# Patient Record
Sex: Male | Born: 1965
Health system: Southern US, Community
[De-identification: ages and names within clinical notes are randomized; demographics above are authoritative.]

## PROBLEM LIST (undated history)

## (undated) DIAGNOSIS — I499 Cardiac arrhythmia, unspecified: Secondary | ICD-10-CM

## (undated) DIAGNOSIS — I639 Cerebral infarction, unspecified: Secondary | ICD-10-CM

## (undated) DIAGNOSIS — R51 Headache: Secondary | ICD-10-CM

## (undated) DIAGNOSIS — R519 Headache, unspecified: Secondary | ICD-10-CM

## (undated) DIAGNOSIS — M109 Gout, unspecified: Secondary | ICD-10-CM

## (undated) DIAGNOSIS — H547 Unspecified visual loss: Secondary | ICD-10-CM

## (undated) DIAGNOSIS — I4891 Unspecified atrial fibrillation: Secondary | ICD-10-CM

## (undated) DIAGNOSIS — I10 Essential (primary) hypertension: Secondary | ICD-10-CM

## (undated) DIAGNOSIS — I509 Heart failure, unspecified: Secondary | ICD-10-CM

## (undated) DIAGNOSIS — M1990 Unspecified osteoarthritis, unspecified site: Secondary | ICD-10-CM

## (undated) DIAGNOSIS — E785 Hyperlipidemia, unspecified: Secondary | ICD-10-CM

## (undated) HISTORY — DX: Gout, unspecified: M10.9

## (undated) HISTORY — DX: Essential (primary) hypertension: I10

## (undated) HISTORY — DX: Cerebral infarction, unspecified: I63.9

---

## 2017-08-22 DIAGNOSIS — I639 Cerebral infarction, unspecified: Secondary | ICD-10-CM

## 2017-08-22 HISTORY — DX: Cerebral infarction, unspecified: I63.9

## 2017-09-12 ENCOUNTER — Inpatient Hospital Stay (HOSPITAL_COMMUNITY): Payer: Commercial Managed Care - PPO

## 2017-09-12 ENCOUNTER — Inpatient Hospital Stay (HOSPITAL_BASED_OUTPATIENT_CLINIC_OR_DEPARTMENT_OTHER): Payer: Commercial Managed Care - PPO

## 2017-09-12 ENCOUNTER — Encounter (HOSPITAL_COMMUNITY): Payer: Self-pay | Admitting: Emergency Medicine

## 2017-09-12 ENCOUNTER — Emergency Department (HOSPITAL_COMMUNITY): Payer: Commercial Managed Care - PPO

## 2017-09-12 ENCOUNTER — Observation Stay (HOSPITAL_COMMUNITY)
Admission: EM | Admit: 2017-09-12 | Discharge: 2017-09-14 | Disposition: A | Discharge status: Home / Self Care | Payer: Commercial Managed Care - PPO | Attending: Internal Medicine | Admitting: Internal Medicine

## 2017-09-12 DIAGNOSIS — L97929 Non-pressure chronic ulcer of unspecified part of left lower leg with unspecified severity: Secondary | ICD-10-CM | POA: Insufficient documentation

## 2017-09-12 DIAGNOSIS — I110 Hypertensive heart disease with heart failure: Secondary | ICD-10-CM | POA: Insufficient documentation

## 2017-09-12 DIAGNOSIS — Z79899 Other long term (current) drug therapy: Secondary | ICD-10-CM | POA: Insufficient documentation

## 2017-09-12 DIAGNOSIS — Z8673 Personal history of transient ischemic attack (TIA), and cerebral infarction without residual deficits: Secondary | ICD-10-CM | POA: Diagnosis not present

## 2017-09-12 DIAGNOSIS — I671 Cerebral aneurysm, nonruptured: Secondary | ICD-10-CM | POA: Diagnosis not present

## 2017-09-12 DIAGNOSIS — Z8679 Personal history of other diseases of the circulatory system: Secondary | ICD-10-CM | POA: Diagnosis not present

## 2017-09-12 DIAGNOSIS — E785 Hyperlipidemia, unspecified: Secondary | ICD-10-CM | POA: Diagnosis not present

## 2017-09-12 DIAGNOSIS — L97909 Non-pressure chronic ulcer of unspecified part of unspecified lower leg with unspecified severity: Secondary | ICD-10-CM | POA: Diagnosis present

## 2017-09-12 DIAGNOSIS — I10 Essential (primary) hypertension: Secondary | ICD-10-CM | POA: Diagnosis present

## 2017-09-12 DIAGNOSIS — L97919 Non-pressure chronic ulcer of unspecified part of right lower leg with unspecified severity: Secondary | ICD-10-CM | POA: Insufficient documentation

## 2017-09-12 DIAGNOSIS — I5022 Chronic systolic (congestive) heart failure: Secondary | ICD-10-CM | POA: Diagnosis not present

## 2017-09-12 DIAGNOSIS — I83019 Varicose veins of right lower extremity with ulcer of unspecified site: Secondary | ICD-10-CM | POA: Diagnosis present

## 2017-09-12 DIAGNOSIS — R200 Anesthesia of skin: Secondary | ICD-10-CM | POA: Insufficient documentation

## 2017-09-12 DIAGNOSIS — Z6841 Body Mass Index (BMI) 40.0 and over, adult: Secondary | ICD-10-CM | POA: Diagnosis not present

## 2017-09-12 DIAGNOSIS — R7303 Prediabetes: Secondary | ICD-10-CM | POA: Diagnosis not present

## 2017-09-12 DIAGNOSIS — I481 Persistent atrial fibrillation: Principal | ICD-10-CM | POA: Insufficient documentation

## 2017-09-12 DIAGNOSIS — R202 Paresthesia of skin: Secondary | ICD-10-CM | POA: Diagnosis not present

## 2017-09-12 DIAGNOSIS — H5462 Unqualified visual loss, left eye, normal vision right eye: Secondary | ICD-10-CM | POA: Diagnosis not present

## 2017-09-12 DIAGNOSIS — R600 Localized edema: Secondary | ICD-10-CM

## 2017-09-12 DIAGNOSIS — I4891 Unspecified atrial fibrillation: Secondary | ICD-10-CM | POA: Diagnosis present

## 2017-09-12 DIAGNOSIS — R2990 Unspecified symptoms and signs involving the nervous system: Secondary | ICD-10-CM

## 2017-09-12 DIAGNOSIS — I6340 Cerebral infarction due to embolism of unspecified cerebral artery: Secondary | ICD-10-CM | POA: Insufficient documentation

## 2017-09-12 DIAGNOSIS — I517 Cardiomegaly: Secondary | ICD-10-CM | POA: Insufficient documentation

## 2017-09-12 DIAGNOSIS — E119 Type 2 diabetes mellitus without complications: Secondary | ICD-10-CM | POA: Insufficient documentation

## 2017-09-12 DIAGNOSIS — I482 Chronic atrial fibrillation, unspecified: Secondary | ICD-10-CM

## 2017-09-12 DIAGNOSIS — I340 Nonrheumatic mitral (valve) insufficiency: Secondary | ICD-10-CM | POA: Diagnosis not present

## 2017-09-12 DIAGNOSIS — L97901 Non-pressure chronic ulcer of unspecified part of unspecified lower leg limited to breakdown of skin: Secondary | ICD-10-CM

## 2017-09-12 DIAGNOSIS — Z7901 Long term (current) use of anticoagulants: Secondary | ICD-10-CM | POA: Insufficient documentation

## 2017-09-12 DIAGNOSIS — Z23 Encounter for immunization: Secondary | ICD-10-CM | POA: Insufficient documentation

## 2017-09-12 DIAGNOSIS — G9389 Other specified disorders of brain: Secondary | ICD-10-CM | POA: Diagnosis not present

## 2017-09-12 DIAGNOSIS — E669 Obesity, unspecified: Secondary | ICD-10-CM | POA: Diagnosis present

## 2017-09-12 DIAGNOSIS — I63431 Cerebral infarction due to embolism of right posterior cerebral artery: Secondary | ICD-10-CM

## 2017-09-12 DIAGNOSIS — R0989 Other specified symptoms and signs involving the circulatory and respiratory systems: Secondary | ICD-10-CM | POA: Diagnosis present

## 2017-09-12 DIAGNOSIS — I83029 Varicose veins of left lower extremity with ulcer of unspecified site: Secondary | ICD-10-CM

## 2017-09-12 HISTORY — DX: Heart failure, unspecified: I50.9

## 2017-09-12 HISTORY — DX: Hyperlipidemia, unspecified: E78.5

## 2017-09-12 HISTORY — DX: Unspecified atrial fibrillation: I48.91

## 2017-09-12 HISTORY — DX: Essential (primary) hypertension: I10

## 2017-09-12 LAB — COMPREHENSIVE METABOLIC PANEL
ALBUMIN: 3.2 g/dL — AB (ref 3.5–5.0)
ALT: 22 U/L (ref 17–63)
AST: 31 U/L (ref 15–41)
Alkaline Phosphatase: 105 U/L (ref 38–126)
Anion gap: 6 (ref 5–15)
BUN: 14 mg/dL (ref 6–20)
CHLORIDE: 102 mmol/L (ref 101–111)
CO2: 29 mmol/L (ref 22–32)
Calcium: 8.6 mg/dL — ABNORMAL LOW (ref 8.9–10.3)
Creatinine, Ser: 1.19 mg/dL (ref 0.61–1.24)
GFR calc Af Amer: >60 mL/min (ref >60)
GFR calc non Af Amer: >60 mL/min (ref >60)
GLUCOSE: 106 mg/dL — AB (ref 65–99)
POTASSIUM: 3.9 mmol/L (ref 3.5–5.1)
SODIUM: 137 mmol/L (ref 135–145)
Total Bilirubin: 1.5 mg/dL — ABNORMAL HIGH (ref 0.3–1.2)
Total Protein: 6.3 g/dL — ABNORMAL LOW (ref 6.5–8.1)

## 2017-09-12 LAB — I-STAT CHEM 8, ED
BUN: 23 mg/dL — AB (ref 6–20)
CREATININE: 1.1 mg/dL (ref 0.61–1.24)
Calcium, Ion: 0.97 mmol/L — ABNORMAL LOW (ref 1.15–1.40)
Chloride: 102 mmol/L (ref 101–111)
GLUCOSE: 107 mg/dL — AB (ref 65–99)
HCT: 39 % (ref 39.0–52.0)
Hemoglobin: 13.3 g/dL (ref 13.0–17.0)
Potassium: 8.3 mmol/L (ref 3.5–5.1)
Sodium: 135 mmol/L (ref 135–145)
TCO2: 30 mmol/L (ref 22–32)

## 2017-09-12 LAB — ECHOCARDIOGRAM COMPLETE
Ao-asc: 33 cm
E decel time: 190 msec
FS: 24 % — AB (ref 28–44)
IV/PV OW: 0.94
LADIAMINDEX: 1.48 cm/m2
LASIZE: 41 mm
LAVOL: 138 mL
LAVOLA4C: 123 mL
LAVOLIN: 50 mL/m2
LDCA: 3.46 cm2
LEFT ATRIUM END SYS DIAM: 41 mm
LV PW d: 17 mm — AB (ref 0.6–1.1)
LVOT diameter: 21 mm
MV Dec: 190
MV pk A vel: 46.1 m/s
MV pk E vel: 155 m/s
MVAP: 3.93 cm2
MVPG: 10 mmHg
P 1/2 time: 56 ms
TAPSE: 22.6 mm
VTI: 152 cm

## 2017-09-12 LAB — DIFFERENTIAL
BASOS PCT: 0 %
Basophils Absolute: 0 10*3/uL (ref 0.0–0.1)
EOS ABS: 0.2 10*3/uL (ref 0.0–0.7)
EOS PCT: 2 %
LYMPHS ABS: 1.3 10*3/uL (ref 0.7–4.0)
Lymphocytes Relative: 13 %
Monocytes Absolute: 0.8 10*3/uL (ref 0.1–1.0)
Monocytes Relative: 8 %
NEUTROS PCT: 77 %
Neutro Abs: 7.4 10*3/uL (ref 1.7–7.7)

## 2017-09-12 LAB — HEMOGLOBIN A1C
HEMOGLOBIN A1C: 5.7 % — AB (ref 4.8–5.6)
Mean Plasma Glucose: 116.89 mg/dL

## 2017-09-12 LAB — RAPID URINE DRUG SCREEN, HOSP PERFORMED
Amphetamines: NOT DETECTED
BARBITURATES: NOT DETECTED
Benzodiazepines: NOT DETECTED
Cocaine: NOT DETECTED
Opiates: NOT DETECTED
Tetrahydrocannabinol: NOT DETECTED

## 2017-09-12 LAB — PROTIME-INR
INR: 1.68
PROTHROMBIN TIME: 19.7 s — AB (ref 11.4–15.2)

## 2017-09-12 LAB — LIPID PANEL
CHOL/HDL RATIO: 3.8 ratio
Cholesterol: 106 mg/dL (ref 0–200)
HDL: 28 mg/dL — AB (ref >40)
LDL CALC: 66 mg/dL (ref 0–99)
TRIGLYCERIDES: 59 mg/dL (ref <150)
VLDL: 12 mg/dL (ref 0–40)

## 2017-09-12 LAB — CBC
HCT: 38.1 % — ABNORMAL LOW (ref 39.0–52.0)
HEMOGLOBIN: 11.6 g/dL — AB (ref 13.0–17.0)
MCH: 25 pg — AB (ref 26.0–34.0)
MCHC: 30.4 g/dL (ref 30.0–36.0)
MCV: 82.1 fL (ref 78.0–100.0)
PLATELETS: 322 10*3/uL (ref 150–400)
RBC: 4.64 MIL/uL (ref 4.22–5.81)
RDW: 18.6 % — ABNORMAL HIGH (ref 11.5–15.5)
WBC: 9.6 10*3/uL (ref 4.0–10.5)

## 2017-09-12 LAB — TSH: TSH: 0.645 u[IU]/mL (ref 0.350–4.500)

## 2017-09-12 LAB — BRAIN NATRIURETIC PEPTIDE: B NATRIURETIC PEPTIDE 5: 405.9 pg/mL — AB (ref 0.0–100.0)

## 2017-09-12 LAB — VITAMIN B12: VITAMIN B 12: 392 pg/mL (ref 180–914)

## 2017-09-12 LAB — CBG MONITORING, ED
Glucose-Capillary: 100 mg/dL — ABNORMAL HIGH (ref 65–99)
Glucose-Capillary: 92 mg/dL (ref 65–99)

## 2017-09-12 LAB — APTT: aPTT: 35 seconds (ref 24–36)

## 2017-09-12 LAB — GLUCOSE, CAPILLARY: GLUCOSE-CAPILLARY: 103 mg/dL — AB (ref 65–99)

## 2017-09-12 LAB — I-STAT TROPONIN, ED: Troponin i, poc: 0 ng/mL (ref 0.00–0.08)

## 2017-09-12 LAB — SEDIMENTATION RATE: Sed Rate: 18 mm/hr — ABNORMAL HIGH (ref 0–16)

## 2017-09-12 MED ORDER — PERFLUTREN LIPID MICROSPHERE
1.0000 mL | INTRAVENOUS | Status: AC | PRN
  Administered 2017-09-12: 3 mL via INTRAVENOUS
  Filled 2017-09-12: qty 10

## 2017-09-12 MED ORDER — CARVEDILOL 3.125 MG PO TABS
3.1250 mg | ORAL_TABLET | Freq: Two times a day (BID) | ORAL | Status: DC
  Administered 2017-09-12 – 2017-09-14 (×4): 3.125 mg via ORAL
  Filled 2017-09-12 (×4): qty 1

## 2017-09-12 MED ORDER — INSULIN ASPART 100 UNIT/ML ~~LOC~~ SOLN: 0.0000 [IU] | SUBCUTANEOUS | Status: DC

## 2017-09-12 MED ORDER — FUROSEMIDE 20 MG PO TABS
40.0000 mg | ORAL_TABLET | Freq: Every day | ORAL | Status: DC
  Administered 2017-09-13 – 2017-09-14 (×2): 40 mg via ORAL
  Filled 2017-09-12 (×2): qty 2

## 2017-09-12 MED ORDER — ROSUVASTATIN CALCIUM 5 MG PO TABS
5.0000 mg | ORAL_TABLET | Freq: Every day | ORAL | Status: DC
Start: 2017-09-12 — End: 2017-09-14
  Administered 2017-09-12 – 2017-09-13 (×2): 5 mg via ORAL
  Filled 2017-09-12 (×3): qty 1

## 2017-09-12 MED ORDER — WARFARIN SODIUM 7.5 MG PO TABS
12.5000 mg | ORAL_TABLET | Freq: Once | ORAL | Status: AC
  Administered 2017-09-12: 12.5 mg via ORAL
  Filled 2017-09-12: qty 1

## 2017-09-12 MED ORDER — ENOXAPARIN SODIUM 40 MG/0.4ML ~~LOC~~ SOLN: 40.0000 mg | SUBCUTANEOUS | Status: DC

## 2017-09-12 MED ORDER — ACETAMINOPHEN 160 MG/5ML PO SOLN: 650.0000 mg | ORAL | Status: DC | PRN

## 2017-09-12 MED ORDER — ACETAMINOPHEN 325 MG PO TABS
650.0000 mg | ORAL_TABLET | ORAL | Status: DC | PRN
  Administered 2017-09-12: 650 mg via ORAL
  Filled 2017-09-12: qty 2

## 2017-09-12 MED ORDER — ACETAMINOPHEN 650 MG RE SUPP: 650.0000 mg | RECTAL | Status: DC | PRN

## 2017-09-12 MED ORDER — ASPIRIN 325 MG PO TABS
325.0000 mg | ORAL_TABLET | Freq: Every day | ORAL | Status: DC
  Administered 2017-09-13 – 2017-09-14 (×2): 325 mg via ORAL
  Filled 2017-09-12 (×2): qty 1

## 2017-09-12 MED ORDER — STROKE: EARLY STAGES OF RECOVERY BOOK
Freq: Once | Status: AC
  Administered 2017-09-12: 16:00:00
  Filled 2017-09-12: qty 1

## 2017-09-12 MED ORDER — INFLUENZA VAC SPLIT QUAD 0.5 ML IM SUSY
0.5000 mL | PREFILLED_SYRINGE | INTRAMUSCULAR | Status: AC
  Administered 2017-09-13: 0.5 mL via INTRAMUSCULAR
  Filled 2017-09-12: qty 0.5

## 2017-09-12 MED ORDER — WARFARIN - PHARMACIST DOSING INPATIENT: Freq: Every day | Status: DC

## 2017-09-12 MED ORDER — ASPIRIN 300 MG RE SUPP: 300.0000 mg | Freq: Every day | RECTAL | Status: DC

## 2017-09-12 NOTE — ED Notes (Signed)
Patient transported to MRI 

## 2017-09-12 NOTE — ED Notes (Signed)
Lovenox and coreg not given as they are not available in the pyxis. Pt taken to floor.

## 2017-09-12 NOTE — ED Notes (Signed)
Pt now back from MRI.

## 2017-09-12 NOTE — ED Notes (Signed)
Patient transported to CT 

## 2017-09-12 NOTE — Progress Notes (Addendum)
ANTICOAGULATION CONSULT NOTE - Initial Consult  Pharmacy Consult for warfarin Indication: atrial fibrillation  No Known Allergies  Patient Measurements:    Vital Signs: Temp: 98.6 F (37 C) (10/22 1011) Temp Source: Oral (10/22 0957) BP: 152/120 (10/22 1300) Pulse Rate: 77 (10/22 1300)  Labs:  Recent Labs  09/12/17 1010 09/12/17 1031 09/12/17 1059  HGB 11.6* 13.3  --   HCT 38.1* 39.0  --   PLT 322  --   --   APTT 35  --   --   LABPROT 19.7*  --   --   INR 1.68  --   --   CREATININE  --  1.10 1.19    CrCl cannot be calculated (Unknown ideal weight.).   Medical History: Past Medical History:  Diagnosis Date  . Atrial fibrillation (HCC)   . CHF (congestive heart failure) (HCC)   . Hypertension     Medications:   (Not in a hospital admission)  Assessment: [age] YOM with h/o Afib on warfairn presented with neurological complaints. Pharmacy consulted to resume home warfarin therapy. H/H and Plt wnl. INR on admission is subtherapeutic at 1.68. Last dose of warfarin was yesterday  Home warfarin dose: 10 mg daily   Drug interactions: asa 325 mg daily   Goal of Therapy:  INR 2-3 Monitor platelets by anticoagulation protocol: Yes   Plan:  -Resume warfarin 12.5 mg once   -Monitor daily PT/INR -D/c lovenox when INR > 2   Francisco Martin, PharmD., BCPS Clinical Pharmacist Pager [phone]

## 2017-09-12 NOTE — ED Notes (Signed)
Echo cardio being completed at bedside.

## 2017-09-12 NOTE — ED Provider Notes (Signed)
[facility] EMERGENCY DEPARTMENT Provider Note   CSN: [id number] Arrival date & time: 09/12/17  6387     History   Chief Complaint Chief Complaint  Patient presents with  . Numbness    HPI Matthan Sledge is a 51 y.o. male.  HPI   Patient's 51 year old male with past medical history significant for A. fib on Coumadin, CHF, hypertension, hyperlipidemia, diabetes. He is presenting today with neurological complaints. He reports that this morning he woke up and he had "feeling of walking on air". He had numbness to bilateral lower extremities. He reports that were clumsy and he fell while trying to stand up from breakfast table. Additionally he reports left-sided visual deficits. He reports that while looking forward his left peripheral vision is fuzzy.  Past Medical History:  Diagnosis Date  . Atrial fibrillation (HCC)   . CHF (congestive heart failure) (HCC)   . Hypertension     There are no active problems to display for this patient.   History reviewed. No pertinent surgical history.     Home Medications    Prior to Admission medications   Medication Sig Start Date End Date Taking? Authorizing Provider  carvedilol (COREG) 12.5 MG tablet Take 25 tablets by mouth 2 (two) times daily. 09/10/17  Yes [provider]  cloNIDine (CATAPRES) 0.1 MG tablet Take 0.1 mg by mouth 3 (three) times daily. 07/31/17  Yes [provider]  furosemide (LASIX) 40 MG tablet Take 40 tablets by mouth daily. 08/16/17  Yes [provider]  Olmesartan-Amlodipine-HCTZ 40-10-25 MG TABS Take 1 tablet by mouth daily. 09/10/17  Yes [provider]  rosuvastatin (CRESTOR) 5 MG tablet Take 5 mg by mouth daily at 6 PM.   Yes [provider]  warfarin (COUMADIN) 10 MG tablet Take 10 mg by mouth daily. 06/22/17  Yes [provider]    Family History No family history on file.  Social History Social History  Substance Use Topics  .  Smoking status: Never Smoker  . Smokeless tobacco: Never Used  . Alcohol use No     Allergies   Patient has no known allergies.   Review of Systems Review of Systems  Constitutional: Negative for activity change.  Respiratory: Negative for shortness of breath.   Cardiovascular: Negative for chest pain.  Gastrointestinal: Negative for abdominal pain.  Neurological: Positive for weakness and numbness.     Physical Exam Updated Vital Signs BP (!) 154/91 (BP Location: Right Arm)   Pulse 75   Temp 98.6 F (37 C) (Oral)   Resp 20   SpO2 98%   Physical Exam  Constitutional: He is oriented to person, place, and time. He appears well-nourished.  HENT:  Head: Normocephalic.  Eyes: Conjunctivae are normal. Right eye exhibits no discharge. Left eye exhibits no discharge.  Cardiovascular: Normal rate and regular rhythm.   Pulmonary/Chest: Effort normal and breath sounds normal. No respiratory distress. He has no wheezes.  Abdominal: Soft. He exhibits no distension. There is no tenderness.  Neurological: He is oriented to person, place, and time.  Left sided homonymous hemianopia  No weakness, mild numbness bilateral LE.  Skin: Skin is warm and dry. He is not diaphoretic.  Psychiatric: He has a normal mood and affect. His behavior is normal.     ED Treatments / Results  Labs (all labs ordered are listed, but only abnormal results are displayed) Labs Reviewed  PROTIME-INR - Abnormal; Notable for the following:       Result  Value   Prothrombin Time 19.7 (*)    All other components within normal limits  CBG MONITORING, ED - Abnormal; Notable for the following:    Glucose-Capillary 100 (*)    All other components within normal limits  I-STAT CHEM 8, ED - Abnormal; Notable for the following:    Potassium 8.3 (*)    BUN 23 (*)    Glucose, Bld 107 (*)    Calcium, Ion 0.97 (*)    All other components within normal limits  APTT  CBC  DIFFERENTIAL  COMPREHENSIVE METABOLIC  PANEL  I-STAT TROPONIN, ED    EKG  EKG Interpretation None       Radiology No results found.  Procedures Procedures (including critical care time)  Medications Ordered in ED Medications - No data to display   Initial Impression / Assessment and Plan / ED Course  I have reviewed the triage vital signs and the nursing notes.  Pertinent labs & imaging results that were available during my care of the patient were reviewed by me and considered in my medical decision making (see chart for details).     Patient's 51 year old male with past medical history significant for A. fib on Coumadin, CHF, hypertension, hyperlipidemia, diabetes. He is presenting today with neurological complaints. He reports that this morning he woke up and he had "feeling of walking on air". He had numbness to bilateral lower extremities. He reports that were clumsy and he fell while trying to stand up from breakfast table. Additionally he reports left-sided visual deficits. He reports that while looking forward his left peripheral vision is fuzzy.   12:48 PM CT shows stroke. Discussed with Onalee HuaMcNeil Kirkpatrick. We'll admit to medicine for stroke workup.  Final Clinical Impressions(s) / ED Diagnoses   Final diagnoses:  None    New Prescriptions New Prescriptions   No medications on file     Abelino DerrickMackuen, Delylah Stanczyk Lyn, MD 09/12/17 1601

## 2017-09-12 NOTE — Progress Notes (Signed)
Notified MD of telemetry report of 2.04 pause, atrial fibrillation.

## 2017-09-12 NOTE — H&P (Signed)
History and Physical    Francisco Noraddie Conly ZOX:[id number] DOB: 04/05/1966 DOA: 09/12/2017   PCP: Simone CuriaLee, Keung, MD Gentry Fitz/UNASSIGNED  Attending physician: Onalee Huaavid  Patient coming from/Resides with: Private residence-6 with roommate  Chief Complaint: Bilateral lower extremity numbness, left visual field disturbance  HPI: Francisco Martin is a 51 y.o. male with medical history significant for obesity, hypertension, CHF of unknown subtype, atrial fibrillation on warfarin, dyslipidemia who presented to the ER after awakening this morning with bilateral leg numbness and reports of blurred vision involving the left eye.  Was in his normal state of health when he went to bed last night.  CT head without contrast revealed no evidence of acute infarction, hemorrhage, hydrocephalus or mass but did reveal mild infarcts in the right thalamus and parasagittal right occipital lobe.  Patient denies prior history of stroke.  INR was not therapeutic at 1.68.  Visual exam consistent with left visual field deficits.  Neurology was consulted.  ED Course: Vital Signs: BP (!) 152/120   Pulse 77   Temp 98.6 F (37 C)   Resp (!) 22   SpO2 98%  CT head: As above PCXR: No acute process Lab data: Sodium 137, potassium 3.9, chloride 102, CO2 29, glucose 106, BUN 14, creatinine 1.19, calcium 8.6, anion gap 6, alkaline phosphatase 105, albumin 3.2, AST 31, ALT 22, total bilirubin 1.5, poc troponin 0.00, white count 9600 with normal differential, hemoglobin 11.6, platelets 322,000, PT 19.7, INR 1.68, PTT 35 Medications and treatments: None  Review of Systems:  In addition to the HPI above,  No Fever-chills, myalgias or other constitutional symptoms No Headache, changes with Vision or hearing, new weakness, tingling, numbness in any extremity, dizziness, dysarthria or word finding difficulty, gait disturbance or imbalance, tremors or seizure activity No problems swallowing food or Liquids, indigestion/reflux, choking or coughing while  eating, abdominal pain with or after eating No Chest pain, Cough or Shortness of Breath, palpitations, orthopnea or DOE No Abdominal pain, N/V, melena,hematochezia, dark tarry stools, constipation No dysuria, malodorous urine, hematuria or flank pain No new skin rashes, lesions, masses or bruises, No new joint pains, aches, swelling or redness No recent unintentional weight gain or loss No polyuria, polydypsia or polyphagia   Past Medical History:  Diagnosis Date  . Atrial fibrillation (HCC)   . CHF (congestive heart failure) (HCC)   . Hypertension     History reviewed. No pertinent surgical history.  Social History   Social History  . Marital status: Unknown    Spouse name: N/A  . Number of children: N/A  . Years of education: N/A   Occupational History  . Not on file.   Social History Main Topics  . Smoking status: Never Smoker  . Smokeless tobacco: Never Used  . Alcohol use No  . Drug use: No  . Sexual activity: Not on file   Other Topics Concern  . Not on file   Social History Narrative  . 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 Authorizing Provider  carvedilol (COREG) 12.5 MG tablet Take 25 tablets by mouth 2 (two) times daily. 09/10/17  Yes [provider]  cloNIDine (CATAPRES) 0.1 MG tablet Take 0.1 mg by mouth 3 (three) times daily. 07/31/17  Yes [provider]  furosemide (LASIX) 40 MG  tablet Take 40 tablets by mouth daily. 08/16/17  Yes [provider]  Olmesartan-Amlodipine-HCTZ 40-10-25 MG TABS Take 1 tablet by mouth daily. 09/10/17  Yes [provider]  rosuvastatin (CRESTOR) 5 MG tablet Take 5 mg by mouth daily at 6 PM.   Yes [provider]  warfarin (COUMADIN) 10 MG tablet Take  10 mg by mouth daily. 06/22/17  Yes [provider]    Physical Exam: Vitals:   09/12/17 1215 09/12/17 1230 09/12/17 1245 09/12/17 1300  BP: (!) 132/96 (!) 129/105 (!) 141/93 (!) 152/120  Pulse: 79 76 64 77  Resp: (!) 21 (!) 30 (!) 23 (!) 22  Temp:      TempSrc:      SpO2: 97% 96% 98% 98%      Constitutional: NAD, calm, comfortable Eyes: PERRL, lids and conjunctivae normal ENMT: Mucous membranes are moist. Posterior pharynx clear of any exudate or lesions. poor dentition. Neck thick Neck: normal, supple, no masses, no thyromegaly Respiratory: clear to auscultation bilaterally, no wheezing, no crackles. Normal respiratory effort. No accessory muscle use.  Cardiovascular: Irregular rate with atrial fibrillation, no rubs / gallops. Grade 3/6 tight systolic murmur left sternal border third intercostal space at the apex.  3+ bilateral lower extremity edema L>R.  Unable to palpate pedal pulses-both feet cool to touch with right foot much cooler than left. No carotid bruits.  Abdomen: no tenderness, no masses palpated. No hepatosplenomegaly. Bowel sounds positive.  Musculoskeletal: no clubbing / cyanosis. No joint deformity upper and lower extremities. Good ROM, no contractures. Normal muscle tone.  Skin: See abnormalities in lower extremities as documented below Neurologic: CN 2-12 grossly intact except for apparent bilateral left upper field visual deficit when tested. Sensation intact, DTR difficult. Strength 5/5 x all 4 extremities.  Psychiatric: Normal judgment and insight. Alert and oriented x 3. Normal mood.    Wound right leg   Wound right leg   Wound left leg   Wound left leg  Labs on Admission: I have personally reviewed following labs and imaging studies  CBC:  Recent Labs Lab 09/12/17 1010 09/12/17 1031  WBC 9.6  --   NEUTROABS 7.4  --   HGB 11.6* 13.3  HCT 38.1* 39.0  MCV 82.1  --   PLT 322  --    Basic Metabolic Panel:  Recent Labs Lab  09/12/17 1031 09/12/17 1059  NA 135 137  K 8.3* 3.9  CL 102 102  CO2  --  29  GLUCOSE 107* 106*  BUN 23* 14  CREATININE 1.10 1.19  CALCIUM  --  8.6*   GFR: CrCl cannot be calculated (Unknown ideal weight.). Liver Function Tests:  Recent Labs Lab 09/12/17 1059  AST 31  ALT 22  ALKPHOS 105  BILITOT 1.5*  PROT 6.3*  ALBUMIN 3.2*   No results for input(s): LIPASE, AMYLASE in the last 168 hours. No results for input(s): AMMONIA in the last 168 hours. Coagulation Profile:  Recent Labs Lab 09/12/17 1010  INR 1.68   Cardiac Enzymes: No results for input(s): CKTOTAL, CKMB, CKMBINDEX, TROPONINI in the last 168 hours. BNP (last 3 results) No results for input(s): PROBNP in the last 8760 hours. HbA1C: No results for input(s): HGBA1C in the last 72 hours. CBG:  Recent Labs Lab 09/12/17 1017  GLUCAP 100*   Lipid Profile: No results for input(s): CHOL, HDL, LDLCALC, TRIG, CHOLHDL, LDLDIRECT in the last 72 hours. Thyroid Function Tests: No results for input(s): TSH, T4TOTAL, FREET4, T3FREE, THYROIDAB in the  last 72 hours. Anemia Panel: No results for input(s): VITAMINB12, FOLATE, FERRITIN, TIBC, IRON, RETICCTPCT in the last 72 hours. Urine analysis: No results found for: COLORURINE, APPEARANCEUR, LABSPEC, PHURINE, GLUCOSEU, HGBUR, BILIRUBINUR, KETONESUR, PROTEINUR, UROBILINOGEN, NITRITE, LEUKOCYTESUR Sepsis Labs: @LABRCNTIP (procalcitonin:4,lacticidven:4) )No results found for this or any previous visit (from the past 240 hour(s)).   Radiological Exams on Admission: Ct Head Wo Contrast  Result Date: 09/12/2017 CLINICAL DATA:  Sudden onset of bilateral leg and foot numbness. Symptoms after taking a shower this morning. EXAM: CT HEAD WITHOUT CONTRAST TECHNIQUE: Contiguous axial images were obtained from the base of the skull through the vertex without intravenous contrast. COMPARISON:  None. FINDINGS: Brain: No evidence of acute infarction, hemorrhage, hydrocephalus,  extra-axial collection or mass lesion/mass effect. There is absence of the septum pellucidum and ventriculomegaly. The temporal horns and third ventricle are not dilated arguing against an obstructive hydrocephalus. There is paucity of cerebral white matter without ventricular scalloping and this may reflect an early gobal insult. Falx is formed and there appears to be an intact, thin corpus callosum. The optic chiasm has a thin appearance, but at the level of the orbits the optic nerve sheath complexes have grossly normal bulk. Well-defined low-density in the right thalamus. Parasagittal right occipital lobe cortically based low-density with volume loss. Question tiny calcification Vascular: No hyperdense vessel or unexpected calcification. Skull: Normal. Negative for fracture or focal lesion. Sinuses/Orbits: No acute finding. IMPRESSION: 1. No acute finding. 2. Remote infarcts in the right thalamus and parasagittal right occipital lobe. 3. Lateral ventriculomegaly which may be sequela of early insult. Absent septum pellucidum. Electronically Signed   By: Marnee Spring M.D.   On: 09/12/2017 11:38   Dg Chest Port 1 View  Result Date: 09/12/2017 CLINICAL DATA:  Numbness in both legs today. EXAM: PORTABLE CHEST 1 VIEW COMPARISON:  None. FINDINGS: The cardiopericardial silhouette is enlarged. Lungs are clear. No pneumothorax or pleural effusion. No focal bony abnormality. IMPRESSION: Prominent cardiopericardial silhouette consistent with cardiomegaly and/or pericardial effusion. Lungs clear. Electronically Signed   By: Drusilla Kanner M.D.   On: 09/12/2017 13:40    EKG: (Independently reviewed) atrial fibrillation with ventricular rate 69 bpm, QTC 456 ms, normal R wave rotation, no acute ischemic changes, no previous EKGs for comparison  Assessment/Plan Principal Problem:   Stroke-like symptoms -Patient presents with acute onset of strokelike symptoms consisting of bilateral lower extremity numbness  (which has improved) and lateral left field focal visual deficit consistent with partial hemianopsia concerning for stroke -Formal neurological consultation in process -CT head does demonstrate evidence of prior remote infarcts in the right thalamus and parasagittal occipital lobe -MR/MRA brain -PT/OT/SLP evaluation -Echocardiogram and carotid duplex -Hemoglobin A1c (patient reports was 5.6 three weeks ago) and FLP -Platelet with full dose aspirin -Frequent neurological checks  Active Problems:   Lower extremity ulceration  -Patient reports wounds related to trauma sustained at hitting legs against objects at work -Recently completed a course of doxycycline -Of note patient has cool feet with nonpalpable pulses and presents with bilateral foot numbness so will obtain ABIs and arterial duplex -WOC RN to assist with wound care -Check TSH, vitamin B12, RBC folate    Atrial fibrillation  -Currently rate controlled -Continue carvedilol -INR subtherapeutic -Pharmacy to manage warfarin dosing -CHADVASC=4 (heart failure, hypertension, history of stroke based on CT scan)    History of chronic CHF -Patient does not know subtype -Follow-up on echocardiogram -On beta-blocker, thiazide diuretic, furosemide and ARB at home -Holding some antihypertensives in setting of  acute strokelike symptoms and need to allow for permissive hypertension -No respiratory symptoms and current chest x-ray unremarkable    HTN (hypertension) -Currently controlled -Continuing carvedilol and Lasix but will hold clonidine, olmesartan, amlodipine and hydrochlorothiazide acutely    HLD (hyperlipidemia) -Continue Crestor    Prediabetes -Patient reports hemoglobin A1c was 5.6 THREE weeks ago    Obesity -Check weight      DVT prophylaxis: Warfarin Code Status: Full Family Communication: No family at bedside Disposition Plan: Home Consults called: Neurology/Kirkpatrick    ELLIS,ALLISON L. ANP-BC [facility] Pager [phone]   If 7PM-7AM, please contact night-coverage [email or web address] Password [email or web address]  09/12/2017, 2:26 PM

## 2017-09-12 NOTE — Progress Notes (Signed)
  Echocardiogram 2D Echocardiogram has been performed.  Francisco Martin 09/12/2017, 4:24 PM

## 2017-09-12 NOTE — Progress Notes (Signed)
Report given by Merry ProudBrandi in the ED regarding patient's condition.

## 2017-09-12 NOTE — Consult Note (Signed)
Neurology Consultation Reason for Consult: Stroke Referring Physician: Dr. Gerald Leitz  CC: Left sided vision loss  History is obtained from: the patient  HPI: Francisco Martin is a 51 y.o. male with a history of atrial fibrillation, hypertension, hyperlipidemia, and congestive heart failure present with new onset of left sided vision loss and bilateral lower extremity parasthesias since waking up this morning. He was feeling in his usual health last night and does not recall any unusual events. His symptoms were present since waking around 7:00am. He notes that the sense of numbness and tingling in his feet has partially improved since this morning but his vision changes have persisted without signfiicant improvement. He has had numbness in his feet before but has never suffered vision deficits or needed corrective lenses. He denies recently missing medication, substance use, trauma, fever, diarrhea, or any loss of consciousness.   LKW: Evening 10/21 tpa given?: no, outside window, anticoagulation Premorbid modified rankin scale: 0 ICH Score: 0  NIHSS: 2 - Bilateral hemianopia   ROS: A 14 point ROS was performed and is negative except as noted in the HPI.  Past Medical History:  Diagnosis Date  . Atrial fibrillation ([place])   . CHF (congestive heart failure) ([place])   . Hypertension      No family history on file.   Social History:  reports that he has never smoked. He has never used smokeless tobacco. He reports that he does not drink alcohol or use drugs.   Exam: Current vital signs: BP (!) 143/67   Pulse 90   Temp 98.3 F (36.8 C) (Oral)   Resp (!) 26   Ht 5' 9"  (1.753 m)   Wt (!) 325 lb (147.4 kg)   SpO2 99%   BMI 47.99 kg/m  Vital signs in last 24 hours: Temp:  [98.3 F (36.8 C)-98.6 F (37 C)] 98.3 F (36.8 C) (10/22 1710) Pulse Rate:  [62-90] 90 (10/22 1710) Resp:  [19-30] 26 (10/22 1615) BP: (129-154)/(67-120) 143/67 (10/22 1710) SpO2:  [94 %-99 %] 99 %  (10/22 1710) Weight:  [325 lb (147.4 kg)] 325 lb (147.4 kg) (10/22 1617)   Physical Exam  Constitutional: Obese man, well-developed and in no acute distress Psych: Affect appropriate to situation Eyes: No scleral injection HENT: No OP obstrucion Head: Normocephalic.  Cardiovascular: Irregular rhythm, normal rate, blowing systolic murmur loudest near RLSB/apex Respiratory: Effort normal and breath sounds normal to anterior ascultation GI: Soft.  No distension. There is no tenderness.  Skin: WDI  Neuro: Mental Status: Patient is awake, alert, oriented to person, place, month, year, and situation. Patient is able to give a clear and coherent history. No signs of aphasia or neglect Cranial Nerves: II: Pupils are equal, round, and reactive to light. Left visual fields are impaired bilaterally III,IV, VI: EOMI without ptosis or diploplia. Cannot track rapid leftward movement without repeated prompting. V: Facial sensation is symmetric to temperature VII: Facial movement is symmetric.  VIII: hearing is intact to voice X: Uvula elevates symmetrically XI: Shoulder shrug is symmetric. XII: tongue is midline without atrophy or fasciculations.  Motor: Tone is normal. Bulk is normal. 5/5 strength was present in all four extremities.  Sensory: Sensation is symmetric to light touch in the arms and legs. Deep Tendon Reflexes: 2+ and symmetric in the patellae.  Plantars: Toes are downgoing bilaterally.  Cerebellar: FNF is intact bilaterally   I have reviewed labs in epic and the results pertinent to this consultation are: INR: 1.68 LDL: 66 Hgb A1c:  5.7% ESR: 18 Serum Creatinine: 1.19   I have reviewed the images obtained:  CT Head Wo Contrast 09/12/2017 1. No acute finding. 2. Remote infarcts in the right thalamus and parasagittal right occipital lobe. 3. Lateral ventriculomegaly which may be sequela of early insult. Absent septum pellucidum.  DG Chest Port 1  View 09/12/2017 Prominent cardiopericardial silhouette consistent with cardiomegaly and/or pericardial effusion.  Impression: Deficits show homonomous left hemianopia suggesting a Right occipital lobe lesion. This would most likely be due to embolic stroke considering his known atrial fibrillation, currently subtherapeutic on coumadin anticoagulation. Intrinsic vessel disease is also possible and he does have risk factors in HTN, HLD, and CHF. There is evidence of previous infarct on CT head in this region but no obvious acute injury. MRI does not demonstrate any acute infarction although old injury is redemonstrated. Possibly there is worsening of existing injured region consider hypoperfusion, generalized insults.  This does not explain his increased foot numbness but this finding appears to be at least partially improved without intervention. This could be related to his lower extremity edema. Folate or B12 deficiency less likely with acute findings but with report of recurrence worth checking. This is less likely related to a CNS process causing vision change.  Recommendations: 1) Lipid panel, Hgb A1c, B12, Folate, HIV 2) Echocardiogram 3) Carotid Dopplers 4) ASA prophylaxis 349m 5) Telemetry monitoring 6) Risk factor modification 7) Consider OT Consultation for visual field deficits  Please see attending note attestation for additional recommendations.  CCollier Salina MD PGY-III Internal Medicine Resident Pager# [phone]/22/2018, 5:41 PM   If 7pm- 7am, please page neurology on call as listed in [facility]

## 2017-09-12 NOTE — ED Notes (Signed)
ED Provider at bedside. 

## 2017-09-12 NOTE — Progress Notes (Signed)
Francisco Martin is a 51 y.o. male patient admitted from ED awake, alert - oriented  X 4 - no acute distress noted.  VSS - Blood pressure (!) 143/67, pulse 90, temperature 98.3 F (36.8 C), temperature source Oral, resp. rate (!) 26, height 5\' 9"  (1.753 m), weight (!) 147.4 kg (325 lb), SpO2 99 %.    IV in place, occlusive dsg intact without redness.  Orientation to room, and floor completed with information packet given to patient/family. Patient declined safety video at this time.  Admission INP armband ID verified with patient/family, and in place.   Stand by assist, fall assessment complete, with patient able to verbalize understanding of risk associated with falls, and verbalized understanding to call nsg before up out of bed. Call light within reach, patient able to voice, and demonstrate understanding.  Skin, clean and dry without evidence of bruising, but has skin tears on BLE.   Evidence of skin break down noted on exam.     Will cont to eval and treat per MD orders.  Orene DesanctisSTANISHA  Jacie Tristan, RN 09/12/2017 5:52 PM

## 2017-09-12 NOTE — ED Triage Notes (Signed)
Pt with [facility] who reports waking up around 0600 with bilateral leg numbness. He went to work but was unable to stay. Pt also reports left side peripheral fussyness, which has not resolved. LKW 2100. Denies chest pain, LOC, SOB. Hx of afib, CHF and HTN. A/O NAD. Vitals stable.  +2-3 pitting edema and weeping in bilateral legs.   Pt on coumadin for a.fib

## 2017-09-12 NOTE — ED Notes (Signed)
Got patient undress on the monitor did ekg shown to Dr Machuen patient is resting with call bell in reach 

## 2017-09-13 ENCOUNTER — Inpatient Hospital Stay (HOSPITAL_COMMUNITY): Payer: Commercial Managed Care - PPO

## 2017-09-13 ENCOUNTER — Inpatient Hospital Stay (HOSPITAL_BASED_OUTPATIENT_CLINIC_OR_DEPARTMENT_OTHER): Payer: Commercial Managed Care - PPO

## 2017-09-13 DIAGNOSIS — I739 Peripheral vascular disease, unspecified: Secondary | ICD-10-CM | POA: Diagnosis not present

## 2017-09-13 DIAGNOSIS — L97929 Non-pressure chronic ulcer of unspecified part of left lower leg with unspecified severity: Secondary | ICD-10-CM

## 2017-09-13 DIAGNOSIS — L97919 Non-pressure chronic ulcer of unspecified part of right lower leg with unspecified severity: Secondary | ICD-10-CM | POA: Diagnosis not present

## 2017-09-13 DIAGNOSIS — I83019 Varicose veins of right lower extremity with ulcer of unspecified site: Secondary | ICD-10-CM | POA: Diagnosis present

## 2017-09-13 DIAGNOSIS — I482 Chronic atrial fibrillation: Secondary | ICD-10-CM

## 2017-09-13 DIAGNOSIS — R0989 Other specified symptoms and signs involving the circulatory and respiratory systems: Secondary | ICD-10-CM | POA: Diagnosis present

## 2017-09-13 DIAGNOSIS — I671 Cerebral aneurysm, nonruptured: Secondary | ICD-10-CM

## 2017-09-13 DIAGNOSIS — E785 Hyperlipidemia, unspecified: Secondary | ICD-10-CM | POA: Diagnosis not present

## 2017-09-13 DIAGNOSIS — R2990 Unspecified symptoms and signs involving the nervous system: Secondary | ICD-10-CM | POA: Diagnosis present

## 2017-09-13 DIAGNOSIS — Z8679 Personal history of other diseases of the circulatory system: Secondary | ICD-10-CM | POA: Diagnosis not present

## 2017-09-13 DIAGNOSIS — I6340 Cerebral infarction due to embolism of unspecified cerebral artery: Secondary | ICD-10-CM | POA: Insufficient documentation

## 2017-09-13 DIAGNOSIS — I83029 Varicose veins of left lower extremity with ulcer of unspecified site: Secondary | ICD-10-CM | POA: Diagnosis not present

## 2017-09-13 DIAGNOSIS — L97901 Non-pressure chronic ulcer of unspecified part of unspecified lower leg limited to breakdown of skin: Secondary | ICD-10-CM | POA: Diagnosis not present

## 2017-09-13 DIAGNOSIS — I63431 Cerebral infarction due to embolism of right posterior cerebral artery: Secondary | ICD-10-CM

## 2017-09-13 LAB — HIV ANTIBODY (ROUTINE TESTING W REFLEX): HIV SCREEN 4TH GENERATION: NONREACTIVE

## 2017-09-13 LAB — PROTIME-INR
INR: 1.36
PROTHROMBIN TIME: 16.7 s — AB (ref 11.4–15.2)

## 2017-09-13 LAB — FOLATE RBC
FOLATE, RBC: 909 ng/mL (ref >498)
Folate, Hemolysate: 338.1 ng/mL
HEMATOCRIT: 37.2 % — AB (ref 37.5–51.0)

## 2017-09-13 LAB — GLUCOSE, CAPILLARY
GLUCOSE-CAPILLARY: 90 mg/dL (ref 65–99)
GLUCOSE-CAPILLARY: 91 mg/dL (ref 65–99)
Glucose-Capillary: 101 mg/dL — ABNORMAL HIGH (ref 65–99)
Glucose-Capillary: 78 mg/dL (ref 65–99)
Glucose-Capillary: 83 mg/dL (ref 65–99)
Glucose-Capillary: 97 mg/dL (ref 65–99)

## 2017-09-13 MED ORDER — WARFARIN SODIUM 7.5 MG PO TABS
15.0000 mg | ORAL_TABLET | Freq: Once | ORAL | Status: AC
  Administered 2017-09-13: 15 mg via ORAL
  Filled 2017-09-13: qty 2

## 2017-09-13 MED ORDER — ENOXAPARIN SODIUM 40 MG/0.4ML ~~LOC~~ SOLN
40.0000 mg | SUBCUTANEOUS | Status: DC
  Administered 2017-09-13 – 2017-09-14 (×2): 40 mg via SUBCUTANEOUS
  Filled 2017-09-13: qty 0.4

## 2017-09-13 NOTE — Progress Notes (Signed)
PROGRESS NOTE  Francisco Martin MVH:[id number] DOB: 1966/07/09 DOA: 09/12/2017 PCP: Francisco Curia, MD  HPI/Recap of past 24 hours: Mr. Francisco Martin is a 51 year old male with medical history significant forAtrial fibrillation on warfarin therapy, hypertension, lipidemia hyperlipidemia, systolic heart failure who presented on 10/22 with new onset of left-sided vision loss and bilateral lower extremity paresthesias found to have previous stroke on MRI imaging (patient was unaware) Neurology was consulted on admission ending presentation consistent with reemergence of previous stroke related deficits.  Hospital course complicated by decreased pedal pulses concerning for PAD versus venous stasis ulcers.  Patient reports no acute events overnight.  Patient denies any cough, shortness of breath, abdominal pain, nausea or vomiting.  Assessment/Plan: Principal Problem:   Stroke-like symptoms Active Problems:   Atrial fibrillation (HCC)   History of chronic CHF   HLD (hyperlipidemia)   HTN (hypertension)   Prediabetes   Obesity   Lower extremity ulceration (HCC)   Venous stasis ulcers of both lower extremities (HCC)   Decreased pedal pulses  Homonymous left hemianopsia, likely reemergence of previous stroke related deficits(remote right PCA and right thalamic infarct)  No acute findings found on MRI brain or MRA to suggest new stroke.   Etiology of chronic stroke likely secondary to A. fib with subtherapeutic INR HIV, folate, hemoglobin A1c, lipid profile all within normal limits -Neurology/stroke team recommendations:  Continue Coumadin and aspirin 325 mg until INR therapeutic, then aspirin can be discontinued  Neurology follow-up on discharge scheduled  Driving restrictions: Patient should not drive unless left hemianopi resolves  Left and right ICA cavernous aneurysm Found on MRA.  Carotid ultrasound showed no evidence of stenosis in right ICA, 1-39% stenosis and left carotid -Follow-up  arranged with IR as outpatient  Chronic paresthesias, bilaterally in feet, stable Decreased pedal pulses, resolved PAD ruled out with normal ABI.  B12 low normal at 392.  Following B12 with concern for possible peripheral neuropathy.  Upon further review with patient numbness in legs is chronic problem -Continue to follow-up with PCP   Venous stasis ulcers on bilateral lower extremities, chronic(POA), stable -Wound care nurse following  -- Recommends Unna boot, weekly wound care or home health nursing for [employer] change  -- Long-term compression stockings  Severe mitral regurgitation, unclear if new Noted on TTE on admission.  No prior history. Suspect ischemic in etiology given diffuse hypokinesis noted on TTE -Follow-up with PCP. - Needs outpatient cardiology follow-up: TEE for better assessment and To rule out ischemic etiology  . Persistent atrial fibrillation, chronic, stable INR subtherapeutic on admission Takes 10 mg warfarin at home.  He may increase dose to 50 mg warfarin 1 time during hospital course. -We will discharged on Coumadin 10 mg in addition to aspirin until INR is therapeutic -Monitor INR   Chronic systolic heart failure, euvolemic TTE shows EF of 40%.  Query ischemic etiology given diffuse hypokinesis. - Carvedilol, Lasix -Appropriate med rec, listed as patient taking combo amlodipine-HCTZ-losartan, but unclear -Daily weights, monitor I's and O's    Code Status: Full code Family Communication: Updated patient, no family at bedside  Disposition Plan: Discharge tomorrow on 10/24   Consultants:  Neurology  Procedures:  10/22: TTE 40-45% with diffuse hypokinesis.  There is possibly severe mitral regurgitation but was not fully visualized.  We will consider TEE to further assess.  The patient was in atrial fibrillation.  Technically difficult study with poor acoustic windows.   10/23 Carotid ultrasound: Right carotid- no stenosis, left carotid-1-39%  stenosis both vertebral  arteries were patent with antegrade flow   10/23 Ankle brachial index: Within normal limits bilaterally at rest.  Right ABI 1.12, left ABI 1.31  Antimicrobials:  None  DVT prophylaxis: Warfarin, aspirin     Objective: Vitals:   09/12/17 2300 09/13/17 0200 09/13/17 0357 09/13/17 0948  BP: 128/88 133/84 139/84 (!) 160/110  Pulse: 83 66 (!) 56 76  Resp: 18 18 18 20   Temp: 98.2 F (36.8 C) 98.8 F (37.1 C) 98.1 F (36.7 C) 97.6 F (36.4 C)  TempSrc: Oral Oral Oral Oral  SpO2: 95% 96% 97% 100%  Weight:      Height:        Intake/Output Summary (Last 24 hours) at 09/13/17 1109 Last data filed at 09/13/17 0908  Gross per 24 hour  Intake                0 ml  Output             1525 ml  Net            -1525 ml   Filed Weights   09/12/17 1617 09/12/17 1753  Weight: (!) 147.4 kg (325 lb) (!) 149.9 kg (330 lb 7.5 oz)    Exam:   General: Sitting in a bedside chair, in no apparent distress, pleasant in conversation  Cardiovascular: Irregularly irregular rhythm  Respiratory: Clear to auscultation on anterior chest field, on room air  Skin: weeping (nonpurulent) drainage from wound on right lower leg, 2 dry, crusted over lesions on lower left leg  Psychiatry: normal affect and mood   Data Reviewed: CBC:  Recent Labs Lab 09/12/17 1010 09/12/17 1031  WBC 9.6  --   NEUTROABS 7.4  --   HGB 11.6* 13.3  HCT 38.1* 39.0  MCV 82.1  --   PLT 322  --    Basic Metabolic Panel:  Recent Labs Lab 09/12/17 1031 09/12/17 1059  NA 135 137  K 8.3* 3.9  CL 102 102  CO2  --  29  GLUCOSE 107* 106*  BUN 23* 14  CREATININE 1.10 1.19  CALCIUM  --  8.6*   GFR: Estimated Creatinine Clearance: 105.2 mL/min (by C-G formula based on SCr of 1.19 mg/dL). Liver Function Tests:  Recent Labs Lab 09/12/17 1059  AST 31  ALT 22  ALKPHOS 105  BILITOT 1.5*  PROT 6.3*  ALBUMIN 3.2*   No results for input(s): LIPASE, AMYLASE in the last 168  hours. No results for input(s): AMMONIA in the last 168 hours. Coagulation Profile:  Recent Labs Lab 09/12/17 1010 09/13/17 0520  INR 1.68 1.36   Cardiac Enzymes: No results for input(s): CKTOTAL, CKMB, CKMBINDEX, TROPONINI in the last 168 hours. BNP (last 3 results) No results for input(s): PROBNP in the last 8760 hours. HbA1C:  Recent Labs  09/12/17 1347  HGBA1C 5.7*   CBG:  Recent Labs Lab 09/12/17 1544 09/12/17 2004 09/13/17 0009 09/13/17 0401 09/13/17 0744  GLUCAP 92 103* 97 83 78   Lipid Profile:  Recent Labs  09/12/17 1347  CHOL 106  HDL 28*  LDLCALC 66  TRIG 59  CHOLHDL 3.8   Thyroid Function Tests:  Recent Labs  09/12/17 1347  TSH 0.645   Anemia Panel:  Recent Labs  09/12/17 1759  VITAMINB12 392   Urine analysis: No results found for: COLORURINE, APPEARANCEUR, LABSPEC, PHURINE, GLUCOSEU, HGBUR, BILIRUBINUR, KETONESUR, PROTEINUR, UROBILINOGEN, NITRITE, LEUKOCYTESUR Sepsis Labs: @LABRCNTIP (procalcitonin:4,lacticidven:4)  )No results found for this or any previous visit (from the past 240  hour(s)).    Studies: Ct Head Wo Contrast  Result Date: 09/12/2017 CLINICAL DATA:  Sudden onset of bilateral leg and foot numbness. Symptoms after taking a shower this morning. EXAM: CT HEAD WITHOUT CONTRAST TECHNIQUE: Contiguous axial images were obtained from the base of the skull through the vertex without intravenous contrast. COMPARISON:  None. FINDINGS: Brain: No evidence of acute infarction, hemorrhage, hydrocephalus, extra-axial collection or mass lesion/mass effect. There is absence of the septum pellucidum and ventriculomegaly. The temporal horns and third ventricle are not dilated arguing against an obstructive hydrocephalus. There is paucity of cerebral white matter without ventricular scalloping and this may reflect an early gobal insult. Falx is formed and there appears to be an intact, thin corpus callosum. The optic chiasm has a thin  appearance, but at the level of the orbits the optic nerve sheath complexes have grossly normal bulk. Well-defined low-density in the right thalamus. Parasagittal right occipital lobe cortically based low-density with volume loss. Question tiny calcification Vascular: No hyperdense vessel or unexpected calcification. Skull: Normal. Negative for fracture or focal lesion. Sinuses/Orbits: No acute finding. IMPRESSION: 1. No acute finding. 2. Remote infarcts in the right thalamus and parasagittal right occipital lobe. 3. Lateral ventriculomegaly which may be sequela of early insult. Absent septum pellucidum. Electronically Signed   By: Marnee Spring M.D.   On: 09/12/2017 11:38   Mr Brain Wo Contrast  Result Date: 09/12/2017 CLINICAL DATA:  Initial evaluation for acute bilateral leg numbness. EXAM: MRI HEAD WITHOUT CONTRAST MRA HEAD WITHOUT CONTRAST TECHNIQUE: Multiplanar, multiecho pulse sequences of the brain and surrounding structures were obtained without intravenous contrast. Angiographic images of the head were obtained using MRA technique without contrast. COMPARISON:  Prior CT from earlier the same day. FINDINGS: MRI HEAD FINDINGS Brain: The generalized age related cerebral atrophy. Mild patchy T2/FLAIR hyperintensity within the periventricular white matter most consistent with chronic small vessel ischemic disease, mild nature. Small remote lacunar infarct present within the right thalamus. Remote right PCA territory infarct present within the parasagittal right occipital lobe. Associated chronic hemorrhagic blood products. No abnormal foci of restricted diffusion to suggest acute or subacute ischemia. Gray-white matter differentiation otherwise maintained. No other areas of chronic infarction. No evidence for acute intracranial hemorrhage. No mass lesion, midline shift, or mass effect. Lateral ventriculomegaly with absence of the septum pellucidum again noted. Third and fourth ventricular size normal. No  extra-axial fluid collection. Major dural sinuses grossly patent. Pituitary gland suprasellar region normal. Midline structures intact and normal. Vascular: Major intracranial vascular flow voids are maintained. Skull and upper cervical spine: Craniocervical junction normal. Upper cervical spine within normal limits. Bone marrow signal intensity normal. No scalp soft tissue abnormality. Sinuses/Orbits: Globes and orbital soft tissues within normal limits. Other: Mild mucosal thickening within the left maxillary sinus. Paranasal sinuses otherwise clear. Trace fluid within the inferior right mastoid air cells, of doubtful significance. Mastoids otherwise clear. Inner ear structures normal. MRA HEAD FINDINGS ANTERIOR CIRCULATION: Distal cervical segments of the internal carotid arteries are widely patent with antegrade flow. Petrous cavernous, and supraclinoid segments widely patent. Approximate 8 x 4 mm focal outpouching extending from the cavernous left ICA and consistent with aneurysm (series 4, image 66). This extends posteriorly and slightly laterally, and demonstrates a fairly wide neck. An additional 2 mm focal outpouching extending laterally from the cavernous right ICA also suspicious for possible small aneurysm (series 4, image 37). ICA termini widely patent. A1 segments widely patent. Normal anterior communicating artery. Anterior cerebral arteries widely patent to  their distal aspects. M1 segments widely patent without stenosis. Normal MCA bifurcations. No proximal M2 occlusion or stenosis. Distal MCA branches well perfused and symmetric. POSTERIOR CIRCULATION: Vertebral arteries patent to the vertebrobasilar junction. Right vertebral artery dominant. Partially visualized posterior inferior cerebral arteries patent bilaterally. Basilar artery widely patent to its distal aspect. Superior cerebral arteries patent bilaterally. Right PCA supplied via the basilar. Fetal type origin of the left PCA. PCAs widely  patent to their distal aspects without stenosis. IMPRESSION: MRI HEAD IMPRESSION: 1. No acute intracranial abnormality identified. 2. Remote right PCA territory infarcts involving the right thalamus and right occipital lobe. 3. Lateral ventriculomegaly with absence of the septum pellucidum. 4. Mild chronic small vessel ischemic disease. MRA HEAD IMPRESSION: 1. Negative intracranial MRA for large vessel occlusion. No high-grade or correctable stenosis. 2. Bilateral cavernous ICA aneurysms as above, left larger than right. Electronically Signed   By: Rise Mu M.D.   On: 09/12/2017 16:50   Dg Chest Port 1 View  Result Date: 09/12/2017 CLINICAL DATA:  Numbness in both legs today. EXAM: PORTABLE CHEST 1 VIEW COMPARISON:  None. FINDINGS: The cardiopericardial silhouette is enlarged. Lungs are clear. No pneumothorax or pleural effusion. No focal bony abnormality. IMPRESSION: Prominent cardiopericardial silhouette consistent with cardiomegaly and/or pericardial effusion. Lungs clear. Electronically Signed   By: Drusilla Kanner M.D.   On: 09/12/2017 13:40   Mr Maxine Glenn Head Wo Contrast  Result Date: 09/12/2017 CLINICAL DATA:  Initial evaluation for acute bilateral leg numbness. EXAM: MRI HEAD WITHOUT CONTRAST MRA HEAD WITHOUT CONTRAST TECHNIQUE: Multiplanar, multiecho pulse sequences of the brain and surrounding structures were obtained without intravenous contrast. Angiographic images of the head were obtained using MRA technique without contrast. COMPARISON:  Prior CT from earlier the same day. FINDINGS: MRI HEAD FINDINGS Brain: The generalized age related cerebral atrophy. Mild patchy T2/FLAIR hyperintensity within the periventricular white matter most consistent with chronic small vessel ischemic disease, mild nature. Small remote lacunar infarct present within the right thalamus. Remote right PCA territory infarct present within the parasagittal right occipital lobe. Associated chronic hemorrhagic  blood products. No abnormal foci of restricted diffusion to suggest acute or subacute ischemia. Gray-white matter differentiation otherwise maintained. No other areas of chronic infarction. No evidence for acute intracranial hemorrhage. No mass lesion, midline shift, or mass effect. Lateral ventriculomegaly with absence of the septum pellucidum again noted. Third and fourth ventricular size normal. No extra-axial fluid collection. Major dural sinuses grossly patent. Pituitary gland suprasellar region normal. Midline structures intact and normal. Vascular: Major intracranial vascular flow voids are maintained. Skull and upper cervical spine: Craniocervical junction normal. Upper cervical spine within normal limits. Bone marrow signal intensity normal. No scalp soft tissue abnormality. Sinuses/Orbits: Globes and orbital soft tissues within normal limits. Other: Mild mucosal thickening within the left maxillary sinus. Paranasal sinuses otherwise clear. Trace fluid within the inferior right mastoid air cells, of doubtful significance. Mastoids otherwise clear. Inner ear structures normal. MRA HEAD FINDINGS ANTERIOR CIRCULATION: Distal cervical segments of the internal carotid arteries are widely patent with antegrade flow. Petrous cavernous, and supraclinoid segments widely patent. Approximate 8 x 4 mm focal outpouching extending from the cavernous left ICA and consistent with aneurysm (series 4, image 66). This extends posteriorly and slightly laterally, and demonstrates a fairly wide neck. An additional 2 mm focal outpouching extending laterally from the cavernous right ICA also suspicious for possible small aneurysm (series 4, image 37). ICA termini widely patent. A1 segments widely patent. Normal anterior communicating artery. Anterior cerebral arteries  widely patent to their distal aspects. M1 segments widely patent without stenosis. Normal MCA bifurcations. No proximal M2 occlusion or stenosis. Distal MCA branches  well perfused and symmetric. POSTERIOR CIRCULATION: Vertebral arteries patent to the vertebrobasilar junction. Right vertebral artery dominant. Partially visualized posterior inferior cerebral arteries patent bilaterally. Basilar artery widely patent to its distal aspect. Superior cerebral arteries patent bilaterally. Right PCA supplied via the basilar. Fetal type origin of the left PCA. PCAs widely patent to their distal aspects without stenosis. IMPRESSION: MRI HEAD IMPRESSION: 1. No acute intracranial abnormality identified. 2. Remote right PCA territory infarcts involving the right thalamus and right occipital lobe. 3. Lateral ventriculomegaly with absence of the septum pellucidum. 4. Mild chronic small vessel ischemic disease. MRA HEAD IMPRESSION: 1. Negative intracranial MRA for large vessel occlusion. No high-grade or correctable stenosis. 2. Bilateral cavernous ICA aneurysms as above, left larger than right. Electronically Signed   By: Rise Mu M.D.   On: 09/12/2017 16:50    Scheduled Meds: . aspirin  300 mg Rectal Daily   Or  . aspirin  325 mg Oral Daily  . carvedilol  3.125 mg Oral BID  . furosemide  40 mg Oral Daily  . insulin aspart  0-9 Units Subcutaneous Q4H  . rosuvastatin  5 mg Oral q1800  . Warfarin - Pharmacist Dosing Inpatient   Does not apply q1800    Continuous Infusions:   LOS: 1 day     Laverna Peace, MD [facility] Pager [phone]  If 7PM-7AM, please contact night-coverage [email or web address] Password [email or web address] 09/13/2017, 11:09 AM

## 2017-09-13 NOTE — Progress Notes (Signed)
Central telemetry called to inform this RN that pt's HR went down to 33, non-sustained. Upon assessment, pt is asymptomatic, HR in the 60's-80's. MD made aware, no new orders obtained. Will continue to monitor and assess.

## 2017-09-13 NOTE — Progress Notes (Addendum)
Attempted to call SW at the number below per patient is cleared for discharge and family is already left for home and the one member that drives is at work. Patient need assistance with ride home. [place]. Unable to leave message due to voice mail being full. 7a-11:30p  [phone] CSW  [phone]

## 2017-09-13 NOTE — Evaluation (Signed)
Occupational Therapy Evaluation Patient Details Name: Francisco Martin MRN: [id number] DOB: 02-06-1966 Today's Date: 09/13/2017    History of Present Illness 51 y.o.malewith medical history significant for obesity, hypertension, CHF of unknown subtype, atrial fibrillation on warfarin, dyslipidemia who presented to the ER after awakening this morning with bilateral leg numbness and reports of blurred vision involving the left eye. MRI showing no acute abnormality and remote right PCA territory infarcts involving the right thalamus and right occipital lobe.   Clinical Impression   PTA, pt was living with a roommate and was independent with ADLs, driving, and working full time. Pt currently requiring set up for UB ADLs in sitting and Min Guard for LB ADLs and stand pivot to recliner. Pt presenting with decreased cognition and deficits in vision. Pt would benefit form further acute OT to facilitate safe dc. Pt motivated to participate in therapy, is young, and had decreased functional performance to baseline. Recommend dc to CIR for further OT and optimize return to PLOF.     Follow Up Recommendations  Supervision/Assistance - 24 hour;CIR    Equipment Recommendations  None recommended by OT    Recommendations for Other Services PT consult;Rehab consult;Speech consult      Precautions / Restrictions Precautions Precautions: Fall Restrictions Weight Bearing Restrictions: No      Mobility Bed Mobility Overal bed mobility: Needs Assistance Bed Mobility: Supine to Sit     Supine to sit: Supervision;HOB elevated     General bed mobility comments: Use of bed rails and HOB elevated  Transfers Overall transfer level: Needs assistance Equipment used: None Transfers: Sit to/from [employer] Transfers Sit to Stand: Min guard Stand pivot transfers: Min guard       General transfer comment: Min Guard for safety    Balance                                          ADL either performed or assessed with clinical judgement   ADL Overall ADL's : Needs assistance/impaired Eating/Feeding: Set up;Sitting   Grooming: Min guard;Standing   Upper Body Bathing: Set up;Sitting   Lower Body Bathing: Min guard;Sit to/from stand   Upper Body Dressing : Set up;Sitting   Lower Body Dressing: Min guard;Sit to/from stand   Toilet Transfer: Stand-pivot;Min guard (simualted to recliner)           Functional mobility during ADLs:  (Stand pivot to recliner) General ADL Comments: Pt demonstrating supervision level for self feeding and UB ADLs. Provided Min Guard for LB ADLs stand pivot to reclienr. Pt BP 160/110 and defer further mobility.      Vision Baseline Vision/History: No visual deficits Patient Visual Report: Other (comment) (Blurry vision on L side of visual field) Vision Assessment?: Yes;Vision impaired- to be further tested in functional context Eye Alignment: Within Functional Limits Ocular Range of Motion: Within Functional Limits Alignment/Gaze Preference: Within Defined Limits Tracking/Visual Pursuits: Able to track stimulus in all quads without difficulty Convergence: Impaired (comment) (decreased convergence despite VCs to follow with eyes) Visual Fields: Impaired-to be further tested in functional context;Other (comment) (Pt with decreased vision to L visual field) Additional Comments: Pt reporting blurry vision and no vision in L visual field. Pt states staring straight ahead, he is unable to see objects on the L side. When covering L eye, pt continues to have decreased vision to L side. With R eye covered, pt requires  object to reach midline to see when coming from L side.      Perception     Praxis      Pertinent Vitals/Pain Pain Assessment: No/denies pain     Hand Dominance Right   Extremity/Trunk Assessment Upper Extremity Assessment Upper Extremity Assessment: Generalized weakness;RUE deficits/detail;LUE deficits/detail RUE  Deficits / Details: Pt with decreased grasp strength and finger dexerity as seen during finger opposition. Also decreased finger to nose, but feel this is possibly due to flower processing LUE Deficits / Details: Pt with decreased grasp strength and finger dexerity as seen during finger opposition. Also decreased finger to nose, but feel this is possibly due to flower processing   Lower Extremity Assessment Lower Extremity Assessment: Defer to PT evaluation   Cervical / Trunk Assessment Cervical / Trunk Assessment: Other exceptions Cervical / Trunk Exceptions: Increased body habitus   Communication Communication Communication: No difficulties   Cognition Arousal/Alertness: Awake/alert Behavior During Therapy: WFL for tasks assessed/performed Overall Cognitive Status: Within Functional Limits for tasks assessed                                 General Comments: Pt demonstrating WFL problem solving and memory. However, required increased cues to follow directions for vision testing and finger-to-nose test. Pt also requiring increased time for word finding task.   General Comments  Once in recliner, pt BP 160/110. NT present and notified    Exercises     Shoulder Instructions      Home Living Family/patient expects to be discharged to:: Private residence Living Arrangements: Non-relatives/Friends Available Help at Discharge: Friend(s);Available 24 hours/day Type of Home: House Home Access: Level entry     Home Layout: One level     Bathroom Shower/Tub: Chief Strategy OfficerTub/shower unit   Bathroom Toilet: Standard     Home Equipment: None   Additional Comments: Pt lives in mobile home with roommate ([name]). Pt mentioning possibly dc to a friend who can be there 24/7. Above information for friend second friend      Prior Functioning/Environment Level of Independence: Independent        Comments: ADLs, IADLs, driving, working (loading tractor trailers)        OT Problem  List: Impaired vision/perception;Decreased cognition;Decreased knowledge of use of DME or AE;Decreased knowledge of precautions      OT Treatment/Interventions: Self-care/ADL training;Therapeutic exercise;Energy conservation;DME and/or AE instruction;Therapeutic activities;Patient/family education    OT Goals(Current goals can be found in the care plan section) Acute Rehab OT Goals Patient Stated Goal: Go home OT Goal Formulation: With patient Time For Goal Achievement: 09/27/17 Potential to Achieve Goals: Good ADL Goals Pt Will Perform Grooming: with modified independence;standing Pt Will Perform Upper Body Dressing: with modified independence;sitting Pt Will Perform Lower Body Dressing: with modified independence;sit to/from stand Additional ADL Goal #1: Pt will perform four step trail making task with Min VCs Additional ADL Goal #2: Pt will independently verbalize three vision compensatory technique  OT Frequency: Min 2X/week   Barriers to D/C:            Co-evaluation              AM-PAC PT "6 Clicks" Daily Activity     Outcome Measure Help from another person eating meals?: None Help from another person taking care of personal grooming?: A Little Help from another person toileting, which includes using toliet, bedpan, or urinal?: A Little Help from another person bathing (including  washing, rinsing, drying)?: A Little Help from another person to put on and taking off regular upper body clothing?: None Help from another person to put on and taking off regular lower body clothing?: A Little 6 Click Score: 20   End of Session Nurse Communication: Mobility status;Other (comment) (BP)  Activity Tolerance: Patient tolerated treatment well Patient left: in chair;with call bell/phone within reach;Other (comment) (No chair alarm box in room; NT notified.)  OT Visit Diagnosis: Muscle weakness (generalized) (M62.81);Other symptoms and signs involving cognitive function;Low  vision, both eyes (H54.2)                Time: 8119-1478 OT Time Calculation (min): 25 min Charges:  OT General Charges $OT Visit: 1 Visit OT Evaluation $OT Eval Moderate Complexity: 1 Mod OT Treatments $Self Care/Home Management : 8-22 mins G-Codes:     Shell Yandow MSOT, OTR/L Acute Rehab Pager: [phone] Office: [phone]   Theodoro Grist Fatou Dunnigan 09/13/2017, 10:11 AM

## 2017-09-13 NOTE — Progress Notes (Signed)
Rehab Admissions Coordinator Note:  Patient was screened by Trish MageLogue, Kymia Simi M for appropriateness for an Inpatient Acute Rehab Consult.  Patient is doing well requiring mostly minguard assist.  Noted some balance issues.  No new neurological event noted.  [facility] outpatient therapy follow up.  Call me for questions.    Trish MageLogue, Katesha Eichel M 09/13/2017, 1:01 PM  I can be reached at [phone].

## 2017-09-13 NOTE — Consult Note (Signed)
WOC Nurse wound consult note Reason for Consult: LE wounds Wound type: venous stasis bilateral LEs Weak pulses, 2+ edema.  Needs ABI prior to compression therapy Pressure Injury POA: NA Measurement: RLE lateral: 1.5cm x 3.0cm x 0.2cm; weeping serosanguinous non purulent drainage.  LLE pretibial 1cm x 1.5cm x 0; dry with crust LLE lateral: 1.5cm x 0.7cm x 0cm; dry, scabbed  Wound bed: see above Drainage (amount, consistency, odor) moderate from the RLE only, no odor Periwound: hemosiderin staining, venous dermatitis  Dressing procedure/placement/frequency: Pending ABI  Will order topical care only for now and follow up after results if compression therapy safe Discussed rationale for compression with patient and the need for lifelong management Would recommend Unna's boots, patient is ambulatory.   Will need weekly wound care center appointments if patient is not homebound (still working) [facility] Unna's [facility]  Will need long term compression stockings 30-4140mmHG, ordered by primary care or wound care center if followed  Kareen Hitsman [facility] MSN,RN,CWOCN, CNS, CWON-AP [phone]

## 2017-09-13 NOTE — Progress Notes (Signed)
VASCULAR LAB PRELIMINARY  ARTERIAL  ABI completed:    RIGHT    LEFT    PRESSURE WAVEFORM  PRESSURE WAVEFORM  BRACHIAL 148 Triphasic BRACHIAL 169 Triphasic  DP 178 Biphasic DP 222 Triphasic  PT 186 Triphasic PT 199 Triphasic    RIGHT LEFT  ABI 1.12 1.31   ABIs and Doppler waveforms are within normal limits bilaterally at rest.  Arline Ketter, RVS 09/13/2017, 4:19 PM

## 2017-09-13 NOTE — Progress Notes (Addendum)
STROKE TEAM PROGRESS NOTE   SUBJECTIVE (INTERVAL HISTORY) His family is at the bedside.  Overall he feels his condition is stable. He describes his symptom as a "blindspot" in the left corner of his left eye. He states the LE "numbness" is a baseline finding that he has had for the past 8 years. Patient verbalizes no new findings. No new events reported overnight.  OBJECTIVE Temp:  [97.6 F (36.4 C)-98.8 F (37.1 C)] 97.6 F (36.4 C) (10/23 0948) Pulse Rate:  [56-90] 76 (10/23 0948) Cardiac Rhythm: Atrial fibrillation (10/23 1031) Resp:  [18-26] 20 (10/23 0948) BP: (128-160)/(67-110) 160/110 (10/23 0948) SpO2:  [95 %-100 %] 100 % (10/23 0948) Weight:  [147.4 kg (325 lb)-149.9 kg (330 lb 7.5 oz)] 149.9 kg (330 lb 7.5 oz) (10/22 1753)   Recent Labs Lab 09/12/17 2004 09/13/17 0009 09/13/17 0401 09/13/17 0744 09/13/17 1204  GLUCAP 103* 97 83 78 101*    Recent Labs Lab 09/12/17 1031 09/12/17 1059  NA 135 137  K 8.3* 3.9  CL 102 102  CO2  --  29  GLUCOSE 107* 106*  BUN 23* 14  CREATININE 1.10 1.19  CALCIUM  --  8.6*    Recent Labs Lab 09/12/17 1059  AST 31  ALT 22  ALKPHOS 105  BILITOT 1.5*  PROT 6.3*  ALBUMIN 3.2*    Recent Labs Lab 09/12/17 1010 09/12/17 1031  WBC 9.6  --   NEUTROABS 7.4  --   HGB 11.6* 13.3  HCT 38.1* 39.0  MCV 82.1  --   PLT 322  --    No results for input(s): CKTOTAL, CKMB, CKMBINDEX, TROPONINI in the last 168 hours.  Recent Labs  09/12/17 1010 09/13/17 0520  LABPROT 19.7* 16.7*  INR 1.68 1.36   No results for input(s): COLORURINE, LABSPEC, PHURINE, GLUCOSEU, HGBUR, BILIRUBINUR, KETONESUR, PROTEINUR, UROBILINOGEN, NITRITE, LEUKOCYTESUR in the last 72 hours.  Invalid input(s): APPERANCEUR     Component Value Date/Time   CHOL 106 09/12/2017 1347   TRIG 59 09/12/2017 1347   HDL 28 (L) 09/12/2017 1347   CHOLHDL 3.8 09/12/2017 1347   VLDL 12 09/12/2017 1347   LDLCALC 66 09/12/2017 1347   Lab Results  Component Value Date    HGBA1C 5.7 (H) 09/12/2017      Component Value Date/Time   LABOPIA NONE DETECTED 09/12/2017 1544   COCAINSCRNUR NONE DETECTED 09/12/2017 1544   LABBENZ NONE DETECTED 09/12/2017 1544   AMPHETMU NONE DETECTED 09/12/2017 1544   THCU NONE DETECTED 09/12/2017 1544   LABBARB NONE DETECTED 09/12/2017 1544    No results for input(s): ETH in the last 168 hours.  I have personally reviewed the radiological images below and agree with the radiology interpretations.  Ct Head Wo Contrast Result Date: 09/12/2017 IMPRESSION: 1. No acute finding. 2. Remote infarcts in the right thalamus and parasagittal right occipital lobe. 3. Lateral ventriculomegaly which may be sequela of early insult. Absent septum pellucidum.   Mri and Mra Head Wo Contrast Result Date: 09/12/2017 MRI HEAD IMPRESSION: 1. No acute intracranial abnormality identified. 2. Remote right PCA territory infarcts involving the right thalamus and right occipital lobe. 3. Lateral ventriculomegaly with absence of the septum pellucidum. 4. Mild chronic small vessel ischemic disease. MRA HEAD IMPRESSION: 1. Negative intracranial MRA for large vessel occlusion. No high-grade or correctable stenosis. 2. Bilateral cavernous ICA aneurysms as above, left larger than right.   2D Echocardiogram:   mild LV hypertrophy. EF 40-45% with diffuse hypokinesis. The RV was poorly visualized. There  was possibly severe mitral regurgitation but it was not fully visualized. Would consider TEE to further assess.  CUS pending   PHYSICAL EXAM  Temp:  [97.6 F (36.4 C)-98.8 F (37.1 C)] 97.6 F (36.4 C) (10/23 0948) Pulse Rate:  [56-90] 76 (10/23 0948) Resp:  [18-26] 20 (10/23 0948) BP: (128-160)/(67-110) 160/110 (10/23 0948) SpO2:  [95 %-100 %] 100 % (10/23 0948) Weight:  [147.4 kg (325 lb)-149.9 kg (330 lb 7.5 oz)] 149.9 kg (330 lb 7.5 oz) (10/22 1753)  General - Well nourished, morbid obesity, in no apparent distress .  Cardiovascular - Regular rate  and rhythm with no murmur.  Mental Status -  Level of arousal and orientation to time, place, and person were intact Language including expression, naming, repetition, comprehension was assessed and found intact Attention span and concentration were normal Recent and remote memory were intact Fund of Knowledge was assessed and was intact  Cranial Nerves II - XII - II - Visual field intact on the right side, + left hemianopia III, IV, VI - Extraocular movements intact  V - Facial sensation intact bilaterally VII - Facial movement intact bilaterally VIII - Hearing & vestibular intact bilaterally X - Palate elevates symmetrically. XI - Chin turning & shoulder shrug intact bilaterally XII - Tongue protrusion intact.  Motor Strength - The patient's strength was normal in all extremities and pronator drift was absent.  Bulk was normal and fasciculations were absent.   Motor Tone - Muscle tone was assessed at the neck and appendages and was normal.  Reflexes - The patient's reflexes were symmetrical in all extremities and he had no pathological reflexes.  Sensory - Light touch, temperature/pinprick were assessed and were symmetrical.    Coordination - The patient had normal movements in the hands and feet with no ataxia or dysmetria.  Tremor was absent.  Gait and Station - deferred.   ASSESSMENT/PLAN Mr. Francisco Martin is a 51 y.o. male with PMH of history of AFIB on coumadin, HTN, HLD, CHF admitted for complaints of left sided vision loss which was new and bilateral lower extremity paraesthesias which was old.   Chronic right PCA and right thalamic stroke - embolic pattern, likely secondary to afib with subtherapeutic INR. Agree with Dr. Amada Jupiter that the visual symptoms likely old but he did not notice until this time.   Resultant - Left Hemianopia  CT - chronic right PCA and right thalamic infarcts  MRI - chronic right PCA and right thalamic infarcts  MRA - left ICA cavernous  aneurysm 8x31mm, right ICA cavernous aneurysm 2mm  CUS pending  TTE - EF 40-45%  LDL 66, HDL 28  INR 1.36 - not on target.   HgbA1c 5.7  Diet Heart Room service appropriate? Yes; Fluid consistency: Thin   warfarin daily prior to admission, now on warfarin daily with ASA bridge. Continue coumadin and ASA 325mg  for now, once INR 2-3, ASA can be discontinued.  Patient counseled to be compliant with his antithrombotic medications  Ongoing aggressive stroke risk factor management  Therapy recommendations:  Outpatient PT/OT  Disposition:  Home, Follow up with Neurology, Cardiology, IR and PCP  Due to left hemianopia, pt can not drive unless hemianopia resolves. Pt stated that he is not driving.   afib on coumadin   INR 1.36, subtherapeutic  Not compliant with INR follow up   Continue coumadin and ASA 325mg  for now, once INR 2-3, ASA can be discontinued.  Pt was educated for medication compliance.  Not a good  DOAC candidate due to weight and possible valvular afib  Follow up with cardiology  ICA aneurysms  Left ICA cavernous aneurysm 8x584mm  Needs IR follow up as outpt for potential intervention  Will set up with Dr. Corliss Skainseveshwar as outpt  Hypertension Stable now, some elevated B/P's noted overnight. Continue Coreg  Long term BP goal normotensive  Hyperlipidemia  Home meds:  Crestor 5mg   LDL 66, goal < 70  Continue home statin at discharge  Other stroke risk factors  Obesity  CHF  Other Active Problems  Right LE wound - Wound care RN consulted. Follow up in wound clinic and close PCP follow up.  Borderline normal B12, folate level wnl, Follow up with PCP  Ventriculomegaly - likely baseline finding, patient denies any cognitive or urinary complaints, suggest close monitoring by Neurology and PCP  Hospital day # 1  Neurology will sign off. Please call with questions. Pt [facility]. Thanks for the  consult.  Marvel PlanJindong Arron Tetrault, MD PhD Stroke Neurology 09/13/2017 3:28 PM   Neurology to delist at this time. Please call with any further questions or concerns regarding this patient.  To contact Stroke Continuity provider, please refer to [email or web address]. After hours, contact General Neurology

## 2017-09-13 NOTE — Evaluation (Signed)
Physical Therapy Evaluation Patient Details Name: Francisco Martin MRN: [id number] DOB: Oct 31, 1966 Today's Date: 09/13/2017   History of Present Illness  51 y/omalewith medical history significant for obesity, hypertension, CHF of unknown subtype, atrial fibrillation on warfarin, dyslipidemia who presented to the ER after awakening this morning with bilateral leg numbness and reports of blurred vision involving the left eye. MRI showing no acute abnormality and remote right PCA territory infarcts involving the right thalamus and right occipital lobe.  Clinical Impression  Pt admitted with above diagnosis. Pt currently with functional limitations due to the deficits listed below (see PT Problem List). At the time of PT eval pt was able to perform transfers and basic ambulation with gross min guard assist. With balance challenges and negotiating in a busy hallway, pt required up to mod assist for cueing, problem solving, and balance support. 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 At this time, do not feel pt would be safe to return to work with the noted cognitive and balance deficits impacting overall function. Recommending CIR stay at d/c to maximize functional independence for return to home and work safely. Pt will benefit from skilled PT to increase their independence and safety with mobility to allow discharge to the venue listed below.       Follow Up Recommendations CIR;Supervision/Assistance - 24 hour    Equipment Recommendations  None recommended by PT    Recommendations for Other Services Rehab consult     Precautions / Restrictions Precautions Precautions: Fall Precaution Comments: Continues to have L side blurred vision Restrictions Weight Bearing Restrictions: No      Mobility  Bed Mobility               General bed mobility comments: Pt sitting up in recliner chair upon PT arrival.   Transfers Overall transfer level: Needs assistance Equipment used:  None Transfers: Sit to/from Stand Sit to Stand: Min guard         General transfer comment: Close guard provided for safety. Appears slightly unsteady initially but did not require any assistance to recover.   Ambulation/Gait Ambulation/Gait assistance: Min guard;Mod assist Ambulation Distance (Feet): 200 Feet Assistive device: None Gait Pattern/deviations: Step-through pattern;Decreased stride length;Wide base of support Gait velocity: Decreased Gait velocity interpretation: Below normal speed for age/gender General Gait Details: Pt ambulating fairly well with a clear hall and no challenges. When negotiating around crowded areas pt with noted slowed problem solving to get around obstacles. Up to mod assist was provided for higher level balance activity.   Stairs            Wheelchair Mobility    Modified Rankin (Stroke Patients Only) Modified Rankin (Stroke Patients Only) Pre-Morbid Rankin Score: No symptoms Modified Rankin: Moderately severe disability     Balance Overall balance assessment: Needs assistance Sitting-balance support: Feet supported;No upper extremity supported Sitting balance-Leahy Scale: Fair     Standing balance support: During functional activity;No upper extremity supported Standing balance-Leahy Scale: Poor Standing balance comment: Requires assist for some dynamic balance activity             High level balance activites: Side stepping;Braiding;Backward walking;Direction changes;Turns;Sudden stops;Head turns High Level Balance Comments: Up to mod assist required. Not able to complete braiding task, even with BUE support and step-by step multimodal cues.  Standardized Balance Assessment Standardized Balance Assessment : Dynamic Gait Index   Dynamic Gait Index Level Surface: Mild Impairment Gait with Horizontal Head Turns: Moderate Impairment Gait with Vertical Head Turns: Moderate Impairment  Gait and Pivot Turn: Mild Impairment Step  Over Obstacle: Severe Impairment Step Around Obstacles: Mild Impairment       Pertinent Vitals/Pain Pain Assessment: No/denies pain    Home Living Family/patient expects to be discharged to:: Private residence Living Arrangements: Non-relatives/Friends Available Help at Discharge: Friend(s);Available 24 hours/day Type of Home: Mobile home Home Access: Level entry     Home Layout: One level Home Equipment: None Additional Comments: Pt lives in mobile home with roommate (Francisco Martin). Pt mentioning possibly dc to a friend who can be there 24/7. Above information for friend     Prior Function Level of Independence: Independent         Comments: ADLs, IADLs, driving, working (loading tractor trailers)     Hand Dominance   Dominant Hand: Right    Extremity/Trunk Assessment   Upper Extremity Assessment Upper Extremity Assessment: Defer to OT evaluation    Lower Extremity Assessment Lower Extremity Assessment: Overall WFL for tasks assessed (Pt reports no N/T throughout testing)    Cervical / Trunk Assessment Cervical / Trunk Assessment: Other exceptions Cervical / Trunk Exceptions: Increased body habitus  Communication   Communication: No difficulties  Cognition Arousal/Alertness: Awake/alert Behavior During Therapy: WFL for tasks assessed/performed Overall Cognitive Status: No family/caregiver present to determine baseline cognitive functioning                                 General Comments: Decreased memory at times. Increased VC's required for multi-step commands, and at times multimodal cues required for higher level balance activity. Discussed with OT and SLP and appears pt is providing inconsistent details of available supervision at d/c and discharge plan.       General Comments      Exercises     Assessment/Plan    PT Assessment Patient needs continued PT services  PT Problem List Decreased strength;Decreased range of motion;Decreased  activity tolerance;Decreased balance;Decreased mobility;Decreased knowledge of use of DME;Decreased safety awareness;Decreased cognition;Decreased coordination       PT Treatment Interventions DME instruction;Gait training;Stair training;Functional mobility training;Therapeutic activities;Therapeutic exercise;Neuromuscular re-education;Patient/family education;Cognitive remediation    PT Goals (Current goals can be found in the Care Plan section)  Acute Rehab PT Goals Patient Stated Goal: Go home PT Goal Formulation: With patient Time For Goal Achievement: 09/20/17 Potential to Achieve Goals: Good    Frequency Min 3X/week   Barriers to discharge        Co-evaluation               AM-PAC PT "6 Clicks" Daily Activity  Outcome Measure Difficulty turning over in bed (including adjusting bedclothes, sheets and blankets)?: None Difficulty moving from lying on back to sitting on the side of the bed? : A Little Difficulty sitting down on and standing up from a chair with arms (e.g., wheelchair, bedside commode, etc,.)?: A Little Help needed moving to and from a bed to chair (including a wheelchair)?: A Little Help needed walking in hospital room?: A Little Help needed climbing 3-5 steps with a railing? : A Lot 6 Click Score: 18    End of Session Equipment Utilized During Treatment: Gait belt Activity Tolerance: Patient tolerated treatment well Patient left: in chair;with call bell/phone within reach;with family/visitor present Nurse Communication: Mobility status (BP status) PT Visit Diagnosis: Other symptoms and signs involving the nervous system (R29.898);Unsteadiness on feet (R26.81)    Time: 1610-9604 PT Time Calculation (min) (ACUTE ONLY): 20 min  Charges:   PT Evaluation $PT Eval Moderate Complexity: 1 Mod     PT G Codes:        Conni SlipperLaura Demarkis Gheen, PT, DPT Acute Rehabilitation Services Pager: [phone]   Marylynn PearsonLaura D Jlyn Bracamonte 09/13/2017, 12:39 PM

## 2017-09-13 NOTE — Evaluation (Signed)
Speech Language Pathology Evaluation Patient Details Name: Francisco Martin MRN: [id number] DOB: 05/15/1966 Today's Date: 09/13/2017 Time: 1016-1030 SLP Time Calculation (min) (ACUTE ONLY): 14 min  Problem List:  Patient Active Problem List   Diagnosis Date Noted  . Stroke-like symptoms 09/12/2017  . Atrial fibrillation (HCC) 09/12/2017  . History of chronic CHF 09/12/2017  . HLD (hyperlipidemia) 09/12/2017  . HTN (hypertension) 09/12/2017  . Prediabetes 09/12/2017  . Obesity 09/12/2017  . Lower extremity ulceration (HCC) 09/12/2017   Past Medical History:  Past Medical History:  Diagnosis Date  . Atrial fibrillation (HCC)   . CHF (congestive heart failure) (HCC)   . Dyslipidemia   . Hypertension    Past Surgical History: History reviewed. No pertinent surgical history. HPI:  51 y.o.malewith a history of atrial fibrillation, hypertension, hyperlipidemia, and congestive heart failure present with new onset of left sided vision loss and bilateral lower extremity parasthesias.  CT with no acute findings; remote infarcts right thalamus and parasagittal right occipital lobe.  Now with homonomous left hemianopia suggesting right occ lobe lesion per neurology.     Assessment / Plan / Recommendation Clinical Impression  During assessment, pt presented with adequate performance on tasks of verbal recall, attention, expressive/receptive language, but demonstrated functional deficits in memory and insight after evaluation, with poor recall of events of morning and inability to recall recommendations and their rationale from staff. Will follow for ongoing assessment and needs post-D/C.  D/W OT/PT.     SLP Assessment  SLP Recommendation/Assessment: Patient needs continued Speech Lanaguage Pathology Services    Follow Up Recommendations  Other (comment) (tba)    Frequency and Duration min 2x/week  1 week      SLP Evaluation Cognition  Overall Cognitive Status: No family/caregiver  present to determine baseline cognitive functioning Arousal/Alertness: Awake/alert Orientation Level: Oriented X4 Attention: Selective Selective Attention: Appears intact Memory:  (inconsistent responses) Awareness: Impaired Awareness Impairment: Other (comment) (requires cues to describe impact of deficits on functioning)       Comprehension  Auditory Comprehension Overall Auditory Comprehension: Appears within functional limits for tasks assessed Visual Recognition/Discrimination Discrimination: Within Function Limits Reading Comprehension Reading Status: Within funtional limits    Expression Verbal Expression Overall Verbal Expression: Appears within functional limits for tasks assessed Written Expression Dominant Hand: Right   Oral / Motor  Oral Motor/Sensory Function Overall Oral Motor/Sensory Function: Within functional limits Motor Speech Overall Motor Speech: Appears within functional limits for tasks assessed   GO                    Blenda MountsCouture, Naveh Rickles Laurice 09/13/2017, 10:54 AM

## 2017-09-13 NOTE — Progress Notes (Signed)
Central telemetry called to inform this RN that the pt had a 2.06 second pause-pt assessed and is asymptomatic, HR now in the 60's-70's-it was noted that 2 leads had come off pt completely-new leads placed. MD notified of pause. No new orders obtained. Will continue to monitor and assess.

## 2017-09-13 NOTE — Progress Notes (Signed)
ANTICOAGULATION CONSULT NOTE - Follow-Up Consult  Pharmacy Consult for warfarin Indication: atrial fibrillation  No Known Allergies  Patient Measurements: Height: 5\' 9"  (175.3 cm) Weight: (!) 330 lb 7.5 oz (149.9 kg) IBW/kg (Calculated) : 70.7  Vital Signs: Temp: 97.6 F (36.4 C) (10/23 0948) Temp Source: Oral (10/23 0948) BP: 160/110 (10/23 0948) Pulse Rate: 76 (10/23 0948)  Labs:  Recent Labs  09/12/17 1010 09/12/17 1031 09/12/17 1059 09/13/17 0520  HGB 11.6* 13.3  --   --   HCT 38.1* 39.0  --   --   PLT 322  --   --   --   APTT 35  --   --   --   LABPROT 19.7*  --   --  16.7*  INR 1.68  --   --  1.36  CREATININE  --  1.10 1.19  --     Estimated Creatinine Clearance: 105.2 mL/min (by C-G formula based on SCr of 1.19 mg/dL).   Medical History: Past Medical History:  Diagnosis Date  . Atrial fibrillation (HCC)   . CHF (congestive heart failure) (HCC)   . Dyslipidemia   . Hypertension     Medications:  Prescriptions Prior to Admission  Medication Sig Dispense Refill Last Dose  . carvedilol (COREG) 12.5 MG tablet Take 25 tablets by mouth 2 (two) times daily.   09/12/2017 at 0700  . cloNIDine (CATAPRES) 0.1 MG tablet Take 0.1 mg by mouth 3 (three) times daily.   09/12/2017 at Unknown time  . furosemide (LASIX) 40 MG tablet Take 40 tablets by mouth daily.   09/11/2017 at Unknown time  . Olmesartan-Amlodipine-HCTZ 40-10-25 MG TABS Take 1 tablet by mouth daily.   09/12/2017 at Unknown time  . rosuvastatin (CRESTOR) 5 MG tablet Take 5 mg by mouth daily at 6 PM.   09/11/2017 at Unknown time  . warfarin (COUMADIN) 10 MG tablet Take 10 mg by mouth daily.   09/11/2017 at 1800    Assessment: 51 yo M with h/o Afib on warfairn presented with neurological complaints. Neuro has evaluated and determined these are likely not new complaints, not related to an acute event as CT and MRI negative for acute findings.  Pt continues on warfarin therapy. H/H and Plt wnl. INR  subtherapeutic at 1.36 and Lovenox 40mg  daily was added.   Home warfarin dose: 10 mg daily   Goal of Therapy:  INR 2-3 Monitor platelets by anticoagulation protocol: Yes   Plan:  -Warfarin 15 mg once   -Monitor daily PT/INR -D/c lovenox when INR > 2   Toys 'R' UsKimberly Vincentina Sollers, Pharm.D., BCPS Clinical Pharmacist Pager: [phone] Clinical phone for 09/13/2017 from 8:30-4:00 is [phone]. After 4pm, please call Main Rx [phone]) for assistance. 09/13/2017 1:42 PM

## 2017-09-14 DIAGNOSIS — H53462 Homonymous bilateral field defects, left side: Secondary | ICD-10-CM | POA: Diagnosis not present

## 2017-09-14 LAB — PROTIME-INR
INR: 1.66
PROTHROMBIN TIME: 19.4 s — AB (ref 11.4–15.2)

## 2017-09-14 LAB — GLUCOSE, CAPILLARY
GLUCOSE-CAPILLARY: 78 mg/dL (ref 65–99)
GLUCOSE-CAPILLARY: 90 mg/dL (ref 65–99)
Glucose-Capillary: 86 mg/dL (ref 65–99)
Glucose-Capillary: 83 mg/dL (ref 65–99)

## 2017-09-14 MED ORDER — FUROSEMIDE 40 MG PO TABS: 40.0000 mg | ORAL_TABLET | Freq: Every day | ORAL | 0 refills | Status: AC

## 2017-09-14 MED ORDER — ASPIRIN 325 MG PO TABS: 325.0000 mg | ORAL_TABLET | Freq: Every day | ORAL | 0 refills | Status: DC

## 2017-09-14 MED ORDER — CARVEDILOL 3.125 MG PO TABS: 3.1250 mg | ORAL_TABLET | Freq: Two times a day (BID) | ORAL | 0 refills | Status: AC

## 2017-09-14 NOTE — Discharge Summary (Signed)
Physician Discharge Summary  Francisco Martin XLK:[id number] DOB: 1966-10-11 DOA: 09/12/2017  PCP: Simone Curia, MD  Admit date: 09/12/2017 Discharge date: 09/14/2017  Admitted From: Home Disposition:  Home  Recommendations for Outpatient Follow-up:  1. Follow up with PCP in 1 week 2. Follow up with Neurology in 6 weeks  3. Follow up with IR for bilateral ICA aneurysm evaluation in 1 month  4. Recommend outpatient cardiology referral for echocardiogram findings as below  5. Patient should not drive unless left hemianopsia resolves  6. Continue coumadin and ASA 325mg  for now, once INR 2-3, ASA can be discontinued. 7. Wound care: Would recommend Unna's boots, patient is ambulatory. HHRN for weekly Unna's boots change  Home Health: PT, RN for wound care  Equipment/Devices: None   Discharge Condition: Stable CODE STATUS: Full  Diet recommendation: Heart healthy   Brief/Interim Summary: Francisco Martin is a 51 year old male with medical history significant for atrial fibrillation on warfarin therapy, hypertension, hyperlipidemia, chronic systolic heart failure who presented on 10/22 with new onset of left-sided vision loss and bilateral lower extremity paresthesias found to have previous stroke on MRI imaging (patient was unaware). Neurology was consulted on admission and deemed his presentation consistent with reemergence of previous stroke related deficits. Hospital course complicated by decreased pedal pulses concerning for PAD versus venous stasis ulcers, ABI was normal. He was discharged home with home health services in stable condition.   Discharge Diagnoses:  Active Problems:   Atrial fibrillation (HCC)   History of chronic CHF   HLD (hyperlipidemia)   HTN (hypertension)   Prediabetes   Obesity   Lower extremity ulceration (HCC)   Venous stasis ulcers of both lower extremities (HCC)   Decreased pedal pulses   Cerebral embolism with cerebral infarction   Cerebral aneurysm without  rupture   Stroke-like symptoms   Homonymous left hemianopsia, likely reemergence of previous stroke related deficits (remote right PCA and right thalamic infarct) -No acute findings found on MRI brain or MRA to suggest new stroke. Etiology of chronic stroke likely secondary to A. fib with subtherapeutic INR. HIV, folate, hemoglobin A1c, lipid profile all within normal limits -Neurology consulted  -Continue Coumadin and aspirin 325 mg until INR therapeutic, then aspirin can be discontinued -Neurology follow-up on discharge scheduled in 6 weeks  -Driving restrictions: Patient should not drive unless left hemianopi resolves  Left and right ICA cavernous aneurysm -Found on MRA. Carotid ultrasound showed no evidence of stenosis in right ICA, 1-39% stenosis and left carotid -Follow-up IR for bilateral ICA aneurysm evaluation in 1 month   Chronic paresthesias, bilaterally in feet, stable -PAD ruled out with normal ABI -Continue to follow-up with PCP  Venous stasis ulcers on bilateral lower extremities, chronic (POA), stable -Wound care nurse following -Recommends Unna boot, weekly wound care or home health nursing for [employer] change -Long-term compression stockings  Severe mitral regurgitation, unclear if new -Noted on TTE on admission.  No prior history. -Suspect ischemic in etiology given diffuse hypokinesis noted on TTE -Follow-up with PCP -Needs outpatient cardiology follow-up: TEE for better assessment and to rule out ischemic etiology   Persistent atrial fibrillation, chronic, stable -INR subtherapeutic on admission -Continue Coumadin and aspirin 325 mg until INR therapeutic, then aspirin can be discontinued  Chronic systolic heart failure, euvolemic -TTE shows EF of 40%. ?ischemic etiology given diffuse hypokinesis. -Carvedilol, Lasix -Stable   Discharge Instructions  Discharge Instructions    Ambulatory referral to Neurology    Complete by:  As directed  Follow  up with stroke clinic Darrol Angel preferred, if not available, then consider Sylvie Farrier, Penumalli or Lucia Gaskins whoever is available) at [facility] in about 6-8 weeks. Thanks.   Diet - low sodium heart healthy    Complete by:  As directed    Discharge instructions    Complete by:  As directed    May not drive until vision change (left hemianopsia) resolves to normal.   Discharge wound care:    Complete by:  As directed    Wound care: Would recommend Unna's boots, patient is ambulatory. HHRN for weekly Unna's boots change   Face-to-face encounter (required for Medicare/Medicaid patients)    Complete by:  As directed    I Noralee Stain certify that this patient is under my care and that I, or a nurse practitioner or physician's assistant working with me, had a face-to-face encounter that meets the physician face-to-face encounter requirements with this patient on 09/14/2017. The encounter with the patient was in whole, or in part for the following medical condition(s) which is the primary reason for home health care (List medical condition): Left hemiamopsia, A Fib, venous stasis ulcer bilaterally   The encounter with the patient was in whole, or in part, for the following medical condition, which is the primary reason for home health care:  venous stasis ulcer bilaterally   I certify that, based on my findings, the following services are medically necessary home health services:   Nursing Physical therapy     Reason for Medically Necessary Home Health Services:   Skilled Nursing- Complex Wound Care Skilled Nursing- Assessment and Observation of Wound Status Therapy- Home Adaptation to Facilitate Safety     My clinical findings support the need for the above services:  Unable to leave home safely without assistance and/or assistive device   Further, I certify that my clinical findings support that this patient is homebound due to:  Unable to leave home safely without assistance   Home Health    Complete  by:  As directed    To provide the following care/treatments:   RN PT     Increase activity slowly    Complete by:  As directed      Allergies as of 09/14/2017   No Known Allergies     Medication List    TAKE these medications   aspirin 325 MG tablet Take 1 tablet (325 mg total) by mouth daily.   carvedilol 3.125 MG tablet Commonly known as:  COREG Take 1 tablet (3.125 mg total) by mouth 2 (two) times daily. What changed:  medication strength  how much to take   cloNIDine 0.1 MG tablet Commonly known as:  CATAPRES Take 0.1 mg by mouth 3 (three) times daily.   furosemide 40 MG tablet Commonly known as:  LASIX Take 1 tablet (40 mg total) by mouth daily. What changed:  how much to take   Olmesartan-Amlodipine-HCTZ 40-10-25 MG Tabs Take 1 tablet by mouth daily.   rosuvastatin 5 MG tablet Commonly known as:  CRESTOR Take 5 mg by mouth daily at 6 PM.   warfarin 10 MG tablet Commonly known as:  COUMADIN Take 10 mg by mouth daily.            Discharge Care Instructions        Start     Ordered   09/14/17 0000  Discharge wound care:    Comments:  Wound care: Would recommend Unna's boots, patient is ambulatory. HHRN for weekly Unna's boots change  09/14/17 0959     Follow-up Information    Julieanne Cotton, MD. Schedule an appointment as soon as possible for a visit in 1 month(s).   Specialty:  Interventional Radiology Why:  Make appointment for  Bilateral ICA Aneurysms evaluation Contact information: [address] [place] [address] [phone]        Nilda Riggs, NP. Schedule an appointment as soon as possible for a visit in 6 week(s).   Specialty:  Family Medicine Why:  Post hospital stroke follow up Contact information: [address] [place] [address] [phone]        Simone Curia, MD. Call in 1 day(s).   Specialty:  Internal Medicine Why:  Need INR check in 2 days, 10/26  Contact  information: [address] [place] [address] [phone]          No Known Allergies  Consultations:  Neurology  Wound RN    Procedures/Studies: Ct Head Wo Contrast  Result Date: 09/12/2017 CLINICAL DATA:  Sudden onset of bilateral leg and foot numbness. Symptoms after taking a shower this morning. EXAM: CT HEAD WITHOUT CONTRAST TECHNIQUE: Contiguous axial images were obtained from the base of the skull through the vertex without intravenous contrast. COMPARISON:  None. FINDINGS: Brain: No evidence of acute infarction, hemorrhage, hydrocephalus, extra-axial collection or mass lesion/mass effect. There is absence of the septum pellucidum and ventriculomegaly. The temporal horns and third ventricle are not dilated arguing against an obstructive hydrocephalus. There is paucity of cerebral white matter without ventricular scalloping and this may reflect an early gobal insult. Falx is formed and there appears to be an intact, thin corpus callosum. The optic chiasm has a thin appearance, but at the level of the orbits the optic nerve sheath complexes have grossly normal bulk. Well-defined low-density in the right thalamus. Parasagittal right occipital lobe cortically based low-density with volume loss. Question tiny calcification Vascular: No hyperdense vessel or unexpected calcification. Skull: Normal. Negative for fracture or focal lesion. Sinuses/Orbits: No acute finding. IMPRESSION: 1. No acute finding. 2. Remote infarcts in the right thalamus and parasagittal right occipital lobe. 3. Lateral ventriculomegaly which may be sequela of early insult. Absent septum pellucidum. Electronically Signed   By: Marnee Spring M.D.   On: 09/12/2017 11:38   Mr Brain Wo Contrast  Result Date: 09/12/2017 CLINICAL DATA:  Initial evaluation for acute bilateral leg numbness. EXAM: MRI HEAD WITHOUT CONTRAST MRA HEAD WITHOUT CONTRAST TECHNIQUE: Multiplanar, multiecho pulse sequences of the  brain and surrounding structures were obtained without intravenous contrast. Angiographic images of the head were obtained using MRA technique without contrast. COMPARISON:  Prior CT from earlier the same day. FINDINGS: MRI HEAD FINDINGS Brain: The generalized age related cerebral atrophy. Mild patchy T2/FLAIR hyperintensity within the periventricular white matter most consistent with chronic small vessel ischemic disease, mild nature. Small remote lacunar infarct present within the right thalamus. Remote right PCA territory infarct present within the parasagittal right occipital lobe. Associated chronic hemorrhagic blood products. No abnormal foci of restricted diffusion to suggest acute or subacute ischemia. Gray-white matter differentiation otherwise maintained. No other areas of chronic infarction. No evidence for acute intracranial hemorrhage. No mass lesion, midline shift, or mass effect. Lateral ventriculomegaly with absence of the septum pellucidum again noted. Third and fourth ventricular size normal. No extra-axial fluid collection. Major dural sinuses grossly patent. Pituitary gland suprasellar region normal. Midline structures intact and normal. Vascular: Major intracranial vascular flow voids are maintained. Skull and  upper cervical spine: Craniocervical junction normal. Upper cervical spine within normal limits. Bone marrow signal intensity normal. No scalp soft tissue abnormality. Sinuses/Orbits: Globes and orbital soft tissues within normal limits. Other: Mild mucosal thickening within the left maxillary sinus. Paranasal sinuses otherwise clear. Trace fluid within the inferior right mastoid air cells, of doubtful significance. Mastoids otherwise clear. Inner ear structures normal. MRA HEAD FINDINGS ANTERIOR CIRCULATION: Distal cervical segments of the internal carotid arteries are widely patent with antegrade flow. Petrous cavernous, and supraclinoid segments widely patent. Approximate 8 x 4 mm focal  outpouching extending from the cavernous left ICA and consistent with aneurysm (series 4, image 66). This extends posteriorly and slightly laterally, and demonstrates a fairly wide neck. An additional 2 mm focal outpouching extending laterally from the cavernous right ICA also suspicious for possible small aneurysm (series 4, image 37). ICA termini widely patent. A1 segments widely patent. Normal anterior communicating artery. Anterior cerebral arteries widely patent to their distal aspects. M1 segments widely patent without stenosis. Normal MCA bifurcations. No proximal M2 occlusion or stenosis. Distal MCA branches well perfused and symmetric. POSTERIOR CIRCULATION: Vertebral arteries patent to the vertebrobasilar junction. Right vertebral artery dominant. Partially visualized posterior inferior cerebral arteries patent bilaterally. Basilar artery widely patent to its distal aspect. Superior cerebral arteries patent bilaterally. Right PCA supplied via the basilar. Fetal type origin of the left PCA. PCAs widely patent to their distal aspects without stenosis. IMPRESSION: MRI HEAD IMPRESSION: 1. No acute intracranial abnormality identified. 2. Remote right PCA territory infarcts involving the right thalamus and right occipital lobe. 3. Lateral ventriculomegaly with absence of the septum pellucidum. 4. Mild chronic small vessel ischemic disease. MRA HEAD IMPRESSION: 1. Negative intracranial MRA for large vessel occlusion. No high-grade or correctable stenosis. 2. Bilateral cavernous ICA aneurysms as above, left larger than right. Electronically Signed   By: Rise MuBenjamin  McClintock M.D.   On: 09/12/2017 16:50   Dg Chest Port 1 View  Result Date: 09/12/2017 CLINICAL DATA:  Numbness in both legs today. EXAM: PORTABLE CHEST 1 VIEW COMPARISON:  None. FINDINGS: The cardiopericardial silhouette is enlarged. Lungs are clear. No pneumothorax or pleural effusion. No focal bony abnormality. IMPRESSION: Prominent  cardiopericardial silhouette consistent with cardiomegaly and/or pericardial effusion. Lungs clear. Electronically Signed   By: Drusilla Kannerhomas  Dalessio M.D.   On: 09/12/2017 13:40   Mr Maxine GlennMra Head Wo Contrast  Result Date: 09/12/2017 CLINICAL DATA:  Initial evaluation for acute bilateral leg numbness. EXAM: MRI HEAD WITHOUT CONTRAST MRA HEAD WITHOUT CONTRAST TECHNIQUE: Multiplanar, multiecho pulse sequences of the brain and surrounding structures were obtained without intravenous contrast. Angiographic images of the head were obtained using MRA technique without contrast. COMPARISON:  Prior CT from earlier the same day. FINDINGS: MRI HEAD FINDINGS Brain: The generalized age related cerebral atrophy. Mild patchy T2/FLAIR hyperintensity within the periventricular white matter most consistent with chronic small vessel ischemic disease, mild nature. Small remote lacunar infarct present within the right thalamus. Remote right PCA territory infarct present within the parasagittal right occipital lobe. Associated chronic hemorrhagic blood products. No abnormal foci of restricted diffusion to suggest acute or subacute ischemia. Gray-white matter differentiation otherwise maintained. No other areas of chronic infarction. No evidence for acute intracranial hemorrhage. No mass lesion, midline shift, or mass effect. Lateral ventriculomegaly with absence of the septum pellucidum again noted. Third and fourth ventricular size normal. No extra-axial fluid collection. Major dural sinuses grossly patent. Pituitary gland suprasellar region normal. Midline structures intact and normal. Vascular: Major intracranial vascular flow voids are  maintained. Skull and upper cervical spine: Craniocervical junction normal. Upper cervical spine within normal limits. Bone marrow signal intensity normal. No scalp soft tissue abnormality. Sinuses/Orbits: Globes and orbital soft tissues within normal limits. Other: Mild mucosal thickening within the left  maxillary sinus. Paranasal sinuses otherwise clear. Trace fluid within the inferior right mastoid air cells, of doubtful significance. Mastoids otherwise clear. Inner ear structures normal. MRA HEAD FINDINGS ANTERIOR CIRCULATION: Distal cervical segments of the internal carotid arteries are widely patent with antegrade flow. Petrous cavernous, and supraclinoid segments widely patent. Approximate 8 x 4 mm focal outpouching extending from the cavernous left ICA and consistent with aneurysm (series 4, image 66). This extends posteriorly and slightly laterally, and demonstrates a fairly wide neck. An additional 2 mm focal outpouching extending laterally from the cavernous right ICA also suspicious for possible small aneurysm (series 4, image 37). ICA termini widely patent. A1 segments widely patent. Normal anterior communicating artery. Anterior cerebral arteries widely patent to their distal aspects. M1 segments widely patent without stenosis. Normal MCA bifurcations. No proximal M2 occlusion or stenosis. Distal MCA branches well perfused and symmetric. POSTERIOR CIRCULATION: Vertebral arteries patent to the vertebrobasilar junction. Right vertebral artery dominant. Partially visualized posterior inferior cerebral arteries patent bilaterally. Basilar artery widely patent to its distal aspect. Superior cerebral arteries patent bilaterally. Right PCA supplied via the basilar. Fetal type origin of the left PCA. PCAs widely patent to their distal aspects without stenosis. IMPRESSION: MRI HEAD IMPRESSION: 1. No acute intracranial abnormality identified. 2. Remote right PCA territory infarcts involving the right thalamus and right occipital lobe. 3. Lateral ventriculomegaly with absence of the septum pellucidum. 4. Mild chronic small vessel ischemic disease. MRA HEAD IMPRESSION: 1. Negative intracranial MRA for large vessel occlusion. No high-grade or correctable stenosis. 2. Bilateral cavernous ICA aneurysms as above, left  larger than right. Electronically Signed   By: Rise Mu M.D.   On: 09/12/2017 16:50     Echo Impressions: - Technically difficult study with poor acoustic windows. The   patient was in atrial fibrillation. Normal LV size with mild LV   hypertrophy. EF 40-45% with diffuse hypokinesis. The RV was   poorly visualized. There was possibly severe mitral regurgitation   but it was not fully visualized. Would consider TEE to further   assess.   Carotid US Final Interpretation: Right Carotid: There is no evicence of stenosis in the right ICA. Left Carotid: There is evidence in the left ICA of a 1-39% stenosis. Vertebrals: Both vertebral arteries were patent with antegrade flow. Subclavians: Normal flow hemodynamics were seen in bilateral subclavian arteries.   ABI Summary: ABIs and Doppler waveforms are within normal limits bilaterally at rest.   Discharge Exam: Vitals:   09/14/17 0453 09/14/17 1432  BP: (!) 150/97 (!) 142/95  Pulse: 73 88  Resp: 18 20  Temp: 98.2 F (36.8 C) 97.7 F (36.5 C)  SpO2: 98% 97%   Vitals:   09/13/17 1632 09/13/17 2052 09/14/17 0453 09/14/17 1432  BP: 128/84 (!) 150/93 (!) 150/97 (!) 142/95  Pulse: 90 95 73 88  Resp: 20 18 18 20   Temp: 98.4 F (36.9 C) 97.7 F (36.5 C) 98.2 F (36.8 C) 97.7 F (36.5 C)  TempSrc: Oral Oral Oral Oral  SpO2: 99% 98% 98% 97%  Weight:      Height:        General: Pt is alert, awake, not in acute distress Cardiovascular: S1/S2 +, no rubs, no gallops Respiratory: CTA bilaterally, no wheezing, no  rhonchi Abdominal: Soft, NT, ND, bowel sounds + Extremities: no edema, no cyanosis    The results of significant diagnostics from this hospitalization (including imaging, microbiology, ancillary and laboratory) are listed below for reference.     Microbiology: No results found for this or any previous visit (from the past 240 hour(s)).   Labs: BNP (last 3 results)  Recent Labs  09/12/17 1347   BNP 405.9*   Basic Metabolic Panel:  Recent Labs Lab 09/12/17 1031 09/12/17 1059  NA 135 137  K 8.3* 3.9  CL 102 102  CO2  --  29  GLUCOSE 107* 106*  BUN 23* 14  CREATININE 1.10 1.19  CALCIUM  --  8.6*   Liver Function Tests:  Recent Labs Lab 09/12/17 1059  AST 31  ALT 22  ALKPHOS 105  BILITOT 1.5*  PROT 6.3*  ALBUMIN 3.2*   No results for input(s): LIPASE, AMYLASE in the last 168 hours. No results for input(s): AMMONIA in the last 168 hours. CBC:  Recent Labs Lab 09/12/17 1010 09/12/17 1031 09/12/17 1759  WBC 9.6  --   --   NEUTROABS 7.4  --   --   HGB 11.6* 13.3  --   HCT 38.1* 39.0 37.2*  MCV 82.1  --   --   PLT 322  --   --    Cardiac Enzymes: No results for input(s): CKTOTAL, CKMB, CKMBINDEX, TROPONINI in the last 168 hours. BNP: Invalid input(s): POCBNP CBG:  Recent Labs Lab 09/13/17 2049 09/14/17 0159 09/14/17 0433 09/14/17 0759 09/14/17 1217  GLUCAP 91 78 86 90 83   D-Dimer No results for input(s): DDIMER in the last 72 hours. Hgb A1c  Recent Labs  09/12/17 1347  HGBA1C 5.7*   Lipid Profile  Recent Labs  09/12/17 1347  CHOL 106  HDL 28*  LDLCALC 66  TRIG 59  CHOLHDL 3.8   Thyroid function studies  Recent Labs  09/12/17 1347  TSH 0.645   Anemia work up  Recent Labs  09/12/17 1759  VITAMINB12 392   Urinalysis No results found for: COLORURINE, APPEARANCEUR, LABSPEC, PHURINE, GLUCOSEU, HGBUR, BILIRUBINUR, KETONESUR, PROTEINUR, UROBILINOGEN, NITRITE, LEUKOCYTESUR Sepsis Labs Invalid input(s): PROCALCITONIN,  WBC,  LACTICIDVEN Microbiology No results found for this or any previous visit (from the past 240 hour(s)).   Time coordinating discharge: 40 minutes  SIGNED:  Noralee Stain, DO [facility] Pager [phone]  If 7PM-7AM, please contact night-coverage [email or web address] Password [facility] 09/14/2017, 5:32 PM

## 2017-09-14 NOTE — Progress Notes (Signed)
Orthopedic Tech Progress Note Patient Details:  Francisco Martin 01/04/1966 [id number]  Ortho Devices Type of Ortho Device: [name] RackUnna boot Ortho Device/Splint Location: bilateral Ortho Device/Splint Interventions: Application   Ferdie Bakken 09/14/2017, 11:00 AM

## 2017-09-14 NOTE — Consult Note (Signed)
WOC reviewed chart. ABI's within normal limits.  Recommend Unnas boots.  Will contact hospitalist and proceed with placement.   Discussed POC with patient and bedside nurse.  Re consult if needed, will not follow at this time. Thanks  Lovie Zarling M[employer] MSN, RN,CWOCN, CNS, CWON-AP [phone])

## 2017-09-14 NOTE — Progress Notes (Signed)
Nsg Discharge Note  Admit Date:  09/12/2017 Discharge date: 09/14/2017   Francisco Martin to be D/C'd Home per MD order.  AVS completed.  Copy for chart, and copy for patient signed, and dated. Patient/caregiver able to verbalize understanding.  Discharge Medication: Allergies as of 09/14/2017   No Known Allergies     Medication List    TAKE these medications   aspirin 325 MG tablet Take 1 tablet (325 mg total) by mouth daily.   carvedilol 3.125 MG tablet Commonly known as:  COREG Take 1 tablet (3.125 mg total) by mouth 2 (two) times daily. What changed:  medication strength  how much to take   cloNIDine 0.1 MG tablet Commonly known as:  CATAPRES Take 0.1 mg by mouth 3 (three) times daily.   furosemide 40 MG tablet Commonly known as:  LASIX Take 1 tablet (40 mg total) by mouth daily. What changed:  how much to take   Olmesartan-Amlodipine-HCTZ 40-10-25 MG Tabs Take 1 tablet by mouth daily.   rosuvastatin 5 MG tablet Commonly known as:  CRESTOR Take 5 mg by mouth daily at 6 PM.   warfarin 10 MG tablet Commonly known as:  COUMADIN Take 10 mg by mouth daily.            Discharge Care Instructions        Start     Ordered   09/14/17 0000  Discharge wound care:    Comments:  Wound care: Would recommend Unna's boots, patient is ambulatory. HHRN for weekly Unna's boots change   09/14/17 0959      Discharge Assessment: Vitals:   09/13/17 2052 09/14/17 0453  BP: (!) 150/93 (!) 150/97  Pulse: 95 73  Resp: 18 18  Temp: 97.7 F (36.5 C) 98.2 F (36.8 C)  SpO2: 98% 98%   Skin clean, dry and intact without evidence of skin break down, no evidence of skin tears noted. IV catheter discontinued intact. Site without signs and symptoms of complications - no redness or edema noted at insertion site, patient denies c/o pain - only slight tenderness at site.  Dressing with slight pressure applied.  D/c Instructions-Education: Discharge instructions given to  patient/family with verbalized understanding. D/c education completed with patient/family including follow up instructions, medication list, d/c activities limitations if indicated, with other d/c instructions as indicated by MD - patient able to verbalize understanding, all questions fully answered. Patient instructed to return to ED, call 911, or call MD for any changes in condition.  Discharge complete. Pt waiting on ride.  Patient will be escorted via WC, and D/C home via private auto.  Francisco FlamingVicki L Enmanuel Zufall, RN 09/14/2017 12:42 PM

## 2017-09-20 NOTE — Progress Notes (Signed)
Physical Therapy Evaluation Addendum for G-Codes    09/13/17 1129  PT G-Codes **NOT FOR INPATIENT CLASS**  Functional Assessment Tool Used AM-PAC 6 Clicks Basic Mobility  Functional Limitation Mobility: Walking and moving around  Mobility: Walking and Moving Around Current Status [phone](G8978) CK  Mobility: Walking and Moving Around Goal Status [phone](G8979) CJ   Conni SlipperLaura Zachary Lovins, PT, DPT Acute Rehabilitation Services Pager: [phone]

## 2017-09-22 ENCOUNTER — Telehealth: Payer: Self-pay

## 2017-09-22 NOTE — Telephone Encounter (Signed)
Revised. 

## 2017-09-22 NOTE — Telephone Encounter (Signed)
Noted. Thanks, Katrina.  Marvel PlanJindong Damont Balles, MD PhD Stroke Neurology 09/22/2017 11:57 AM
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
## 2017-12-09 ENCOUNTER — Ambulatory Visit: Payer: Commercial Managed Care - PPO | Admitting: Diagnostic Neuroimaging

## 2017-12-09 ENCOUNTER — Encounter: Payer: Self-pay | Admitting: Diagnostic Neuroimaging

## 2017-12-09 VITALS — BP 170/113 | HR 71 | Ht 69.0 in | Wt 314.0 lb

## 2017-12-09 DIAGNOSIS — I480 Paroxysmal atrial fibrillation: Secondary | ICD-10-CM

## 2017-12-09 DIAGNOSIS — R200 Anesthesia of skin: Secondary | ICD-10-CM

## 2017-12-09 DIAGNOSIS — I6930 Unspecified sequelae of cerebral infarction: Secondary | ICD-10-CM

## 2017-12-09 DIAGNOSIS — G919 Hydrocephalus, unspecified: Secondary | ICD-10-CM

## 2017-12-09 DIAGNOSIS — I671 Cerebral aneurysm, nonruptured: Secondary | ICD-10-CM | POA: Diagnosis not present

## 2017-12-09 NOTE — Patient Instructions (Signed)
  Return to work evaluation - follow up with eye doctor re: left hemianopsia and visual field testing / monitoring and driving safety - refer to PT / OT --> functional capacity evaluation for return to work  Atrial fibrillation - Continue eliquis - Stop aspirin - Follow up with cardiology  Severe Mitral valve regurgitation - follow up with cardiology  Bilateral ICA aneurysms (asymptomatic, unruptured) - Left ICA cavernous aneurysm 8x334mm --> refer to Dr. Corliss Skainseveshwar for neuroIR consultation for potential intervention  Hypertension, uncontrolled - continue BP meds --> follow up with Wenda LowAnna Potts, NP  Hyperlipidemia - continue crestor   Lower extremity numbness (lumbar radiculopathy vs obesity neuropathy) - check MRI lumbar spine  Cerebral ventriculomegaly - unclear etiology; will repeat CT head

## 2017-12-09 NOTE — Progress Notes (Signed)
[facility]  PATIENT: Francisco Martin DOB: 11/15/1966  REFERRING CLINICIAN: Hospital (Dr. Alvino Chapelhoi) HISTORY FROM: patient and chart review REASON FOR VISIT: new consult    HISTORICAL  CHIEF COMPLAINT:  Chief Complaint  Patient presents with  . Hospitalization Follow-up  . Cerebrovascular Accident    elevated Bp, has taken meds this am.  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      HISTORY OF PRESENT ILLNESS:   52 year old male here for hospital discharge follow-up, chronic right PCA right thalamic stroke, left-sided vision loss, lower extremity numbness and tingling.  Patient has history of hypertension, hyperlipidemia, CHF, atrial fibrillation.  Patient presented to hospital 09/12/17 for new onset of lower extremity numbness and tingling, vision problems.  She was admitted to the hospital for workup.  MRI showed no acute findings.  However he was found to have chronic right PCA and right thalamic ischemic infarctions.  He was also found to have bilateral cavernous ICA aneurysms (right 2 mm, left 4 x 8 mm).  Patient was subtherapeutic on Coumadin at time of admission.  Since discharge he has been switched to Eliquis.  Patient is doing better.  He still has vision loss of his left side.  He does not feel like he can return to work due to vision loss and numbness and tingling.    REVIEW OF SYSTEMS: Full 14 system review of systems performed and negative with exception of: Confusion numbness weakness blurred vision swelling in legs ringing in ears.  ALLERGIES: No Known Allergies  HOME MEDICATIONS: Outpatient Medications Prior to Visit  Medication Sig Dispense Refill  . apixaban (ELIQUIS) 2.5 MG TABS tablet Take 2.5 mg by mouth 2 (two) times daily.    Marland Kitchen. aspirin 325 MG tablet Take 1 tablet (325 mg total) by mouth daily. 30 tablet 0  . carvedilol (COREG) 3.125 MG tablet Take 1 tablet (3.125 mg total) by mouth 2 (two) times daily. 60 tablet 0  .  cloNIDine (CATAPRES) 0.1 MG tablet Take 0.1 mg by mouth 3 (three) times daily.    . furosemide (LASIX) 40 MG tablet Take 1 tablet (40 mg total) by mouth daily. 30 tablet 0  . Olmesartan-Amlodipine-HCTZ 40-10-25 MG TABS Take 1 tablet by mouth daily.    . rosuvastatin (CRESTOR) 5 MG tablet Take 5 mg by mouth daily at 6 PM.    . warfarin (COUMADIN) 10 MG tablet Take 10 mg by mouth daily.     No facility-administered medications prior to visit.     PAST MEDICAL HISTORY: Past Medical History:  Diagnosis Date  . Atrial fibrillation (HCC)   . CHF (congestive heart failure) (HCC)   . Dyslipidemia   . Hypertension     PAST SURGICAL HISTORY: No past surgical history on file.  FAMILY HISTORY: No family history on file.  SOCIAL HISTORY:  Social History   Socioeconomic History  . Marital status: Unknown    Spouse name: Not on file  . Number of children: Not on file  . Years of education: Not on file  . Highest education level: Not on file  Social Needs  . Financial resource strain: Not on file  . Food insecurity - worry: Not on file  . Food insecurity - inability: Not on file  . Transportation needs - medical: Not on file  . Transportation needs - non-medical: Not on file  Occupational History  . Not on file  Tobacco Use  . Smoking status: Never Smoker  . Smokeless tobacco: Never  Used  Substance and Sexual Activity  . Alcohol use: No  . Drug use: No  . Sexual activity: Not Currently  Other Topics Concern  . Not on file  Social History Narrative  . Not on file     PHYSICAL EXAM  GENERAL EXAM/CONSTITUTIONAL: Vitals:  Vitals:   12/09/17 0908 12/09/17 0922  BP: (!) 201/133 (!) 170/113  Pulse: 74 71  Weight: (!) 314 lb (142.4 kg)   Height: 5\' 9"  (1.753 m)      Body mass index is 46.37 kg/m.  No exam data present  Patient is in no distress; well developed, nourished and groomed; neck is supple  CARDIOVASCULAR:  Examination of carotid arteries is normal; no  carotid bruits  IRREGULAR RATE AND RHYTHM; SYSTOLIC MURMUR  Examination of peripheral vascular system by observation and palpation is normal  EYES:  Ophthalmoscopic exam of optic discs and posterior segments is normal; no papilledema or hemorrhages  MUSCULOSKELETAL:  Gait, strength, tone, movements noted in Neurologic exam below  NEUROLOGIC: MENTAL STATUS:  No flowsheet data found.  awake, alert, oriented to person, place and time  recent and remote memory intact  normal attention and concentration  language fluent, comprehension intact, naming intact,   fund of knowledge appropriate  CRANIAL NERVE:   2nd - no papilledema on fundoscopic exam  2nd, 3rd, 4th, 6th - pupils equal and reactive to light, visual fields LEFT HOMONYMOUS HEMIANOPSIA; extraocular muscles intact, no nystagmus  5th - facial sensation symmetric  7th - facial strength symmetric  8th - hearing intact  9th - palate elevates symmetrically, uvula midline  11th - shoulder shrug symmetric  12th - tongue protrusion midline  MOTOR:   normal bulk and tone, full strength in the BUE, BLE  SENSORY:   normal and symmetric to light touch, temperature, vibration  COORDINATION:   finger-nose-finger, fine finger movements normal  REFLEXES:   deep tendon reflexes TRACE and symmetric  GAIT/STATION:   narrow based gait; SLIGHTLY SLOW    DIAGNOSTIC DATA (LABS, IMAGING, TESTING) - I reviewed patient records, labs, notes, testing and imaging myself where available.  Lab Results  Component Value Date   WBC 9.6 09/12/2017   HGB 13.3 09/12/2017   HCT 37.2 (L) 09/12/2017   MCV 82.1 09/12/2017   PLT 322 09/12/2017      Component Value Date/Time   NA 137 09/12/2017 1059   K 3.9 09/12/2017 1059   CL 102 09/12/2017 1059   CO2 29 09/12/2017 1059   GLUCOSE 106 (H) 09/12/2017 1059   BUN 14 09/12/2017 1059   CREATININE 1.19 09/12/2017 1059   CALCIUM 8.6 (L) 09/12/2017 1059   PROT 6.3 (L)  09/12/2017 1059   ALBUMIN 3.2 (L) 09/12/2017 1059   AST 31 09/12/2017 1059   ALT 22 09/12/2017 1059   ALKPHOS 105 09/12/2017 1059   BILITOT 1.5 (H) 09/12/2017 1059   GFRNONAA >60 09/12/2017 1059   GFRAA >60 09/12/2017 1059   Lab Results  Component Value Date   CHOL 106 09/12/2017   HDL 28 (L) 09/12/2017   LDLCALC 66 09/12/2017   TRIG 59 09/12/2017   CHOLHDL 3.8 09/12/2017   Lab Results  Component Value Date   HGBA1C 5.7 (H) 09/12/2017   Lab Results  Component Value Date   VITAMINB12 392 09/12/2017   Lab Results  Component Value Date   TSH 0.645 09/12/2017    CUS - no ICA stenosis; antegrade flow in vertebral arteries  TTE - EF 40-45%  LDL 66, HDL  28  HgbA1c 5.7  09/12/17 MRI brain [I reviewed images myself and agree with interpretation. Moderate lateral ventriculomegaly.  -VRP]  1. No acute intracranial abnormality identified. 2. Remote right PCA territory infarcts involving the right thalamus and right occipital lobe. 3. Lateral ventriculomegaly with absence of the septum pellucidum.  4. Mild chronic small vessel ischemic disease.  09/12/17 MRA HEAD [I reviewed images myself and agree with interpretation. -VRP]  1. Negative intracranial MRA for large vessel occlusion. No high-grade or correctable stenosis. 2. Bilateral cavernous ICA aneurysms as above, left larger than right. - Approximate 8 x 4 mm focal outpouching extending from the cavernous left ICA and consistent with aneurysm (series 4, image 66). This extends posteriorly and slightly laterally, and demonstrates a fairly wide neck. An additional 2 mm focal outpouching extending laterally from the cavernous right ICA also suspicious for possible small aneurysm (series 4, image 37).  09/12/17 TTE - Technically difficult study with poor acoustic windows. The   patient was in atrial fibrillation. Normal LV size with mild LV   hypertrophy. EF 40-45% with diffuse hypokinesis. The RV was   poorly visualized.  There was possibly severe mitral regurgitation   but it was not fully visualized. Would consider TEE to further   assess.    ASSESSMENT AND PLAN  52 y.o. year old male here with atrial fibrillation, HTN, hyperlipidemia, CHF admitted for complaints of left sided vision loss and bilateral lower extremity paresthesias.  Dx:  1. Chronic ischemic right PCA stroke   2. Paroxysmal atrial fibrillation (HCC)   3. Cerebral aneurysm without rupture   4. Hydrocephalus   5. Lower extremity numbness      PLAN:  Chronic right PCA and right thalamic stroke - embolic pattern, likely secondary to afib with subtherapeutic INR.  - secondary stroke prevention  Return to work evaluation - follow up with eye doctor re: left hemianopsia and visual field testing / monitoring and driving safety - PT / OT --> functional capacity evaluation for return to work  Atrial fibrillation - Continue eliquis - Stop aspirin - Follow up with cardiology  Severe Mitral valve regurgitation - follow up with cardiology  Bilateral ICA aneurysms (asymptomatic, unruptured) - Left ICA cavernous aneurysm 8x49mm --> refer to Dr. Corliss Skains for neuroIR consultation for potential intervention  Hypertension, uncontrolled - continue BP meds --> follow up with PCP  Hyperlipidemia - continue crestor   Lower extremity numbness (lumbar radiculopathy vs obesity neuropathy) - check MRI lumbar spine  Cerebral ventriculomegaly - unclear etiology; will repeat CT head   Orders Placed This Encounter  Procedures  . CT HEAD WO CONTRAST  . MR LUMBAR SPINE WO CONTRAST  . Ambulatory referral to Physical Therapy  . Ambulatory referral to Occupational Therapy  . Ambulatory referral to Interventional Radiology   Return in about 6 months (around 06/08/2018).  I reviewed images, labs, notes, records myself. I summarized findings and reviewed with patient, for this high risk condition (stroke, aneurysms, gait diff) requiring high  complexity decision making.    Suanne Marker, MD 12/09/2017, 9:23 AM Certified in Neurology, Neurophysiology and Neuroimaging  [facility] [address], [address] [place], [place] [address] [phone]

## 2017-12-12 ENCOUNTER — Other Ambulatory Visit (HOSPITAL_COMMUNITY): Payer: Self-pay | Admitting: Interventional Radiology

## 2017-12-12 ENCOUNTER — Telehealth (HOSPITAL_COMMUNITY): Payer: Self-pay | Admitting: Radiology

## 2017-12-12 ENCOUNTER — Telehealth: Payer: Self-pay | Admitting: Diagnostic Neuroimaging

## 2017-12-12 DIAGNOSIS — I671 Cerebral aneurysm, nonruptured: Secondary | ICD-10-CM

## 2017-12-12 NOTE — Telephone Encounter (Signed)
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  He is to call back if questions.  I did LM that did fax ofv note to Marguarite Arbouroni Reitz at [employer] at his request (fax confirmation received).

## 2017-12-12 NOTE — Telephone Encounter (Signed)
Pt job is requesting a note that pt has been taken out of work indefinitely. Please address to Attn: Vonita MossRobyn Stewart (p) [phone] (f) [phone]

## 2017-12-12 NOTE — Telephone Encounter (Signed)
Called pt, left VM for him to call to schedule his visit with Deveshwar for aneurysm tx. JM

## 2017-12-12 NOTE — Telephone Encounter (Signed)
Pt was seen on 12-09-17 for initial visit with Dr. Marjory LiesPenumalli.  Pts ovf note to be faxed to [employer] Attention Marguarite ArbourRoni Reitz when available.  Per Dr. 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

## 2017-12-12 NOTE — Telephone Encounter (Signed)
Fax confirmation received to [employer], [employer].  [phone]. [phone]. [phone].

## 2017-12-12 NOTE — Telephone Encounter (Signed)
Note completed. -VRP

## 2017-12-13 NOTE — Telephone Encounter (Signed)
Pt called back and is needing note from neurologist per pcp as to note requested from his work.  (this needed due to keeping his job).

## 2017-12-15 ENCOUNTER — Ambulatory Visit (HOSPITAL_COMMUNITY)
Admission: RE | Admit: 2017-12-15 | Discharge: 2017-12-15 | Disposition: A | Discharge status: Home / Self Care | Payer: Commercial Managed Care - PPO | Source: Ambulatory Visit | Attending: Interventional Radiology | Admitting: Interventional Radiology

## 2017-12-15 DIAGNOSIS — I671 Cerebral aneurysm, nonruptured: Secondary | ICD-10-CM

## 2017-12-15 HISTORY — PX: IR RADIOLOGIST EVAL & MGMT: IMG5224

## 2017-12-19 ENCOUNTER — Encounter (HOSPITAL_COMMUNITY): Payer: Self-pay | Admitting: Interventional Radiology

## 2017-12-20 ENCOUNTER — Other Ambulatory Visit (HOSPITAL_COMMUNITY): Payer: Self-pay | Admitting: Interventional Radiology

## 2017-12-20 ENCOUNTER — Telehealth (HOSPITAL_COMMUNITY): Payer: Self-pay

## 2017-12-20 DIAGNOSIS — I671 Cerebral aneurysm, nonruptured: Secondary | ICD-10-CM

## 2017-12-20 NOTE — Telephone Encounter (Signed)
Called to schedule angio, left message for pt to return call. AW 

## 2017-12-28 ENCOUNTER — Ambulatory Visit
Admission: RE | Admit: 2017-12-28 | Discharge: 2017-12-28 | Disposition: A | Discharge status: Home / Self Care | Payer: Commercial Managed Care - PPO | Source: Ambulatory Visit | Attending: Diagnostic Neuroimaging | Admitting: Diagnostic Neuroimaging

## 2017-12-28 DIAGNOSIS — R200 Anesthesia of skin: Secondary | ICD-10-CM

## 2017-12-28 DIAGNOSIS — R262 Difficulty in walking, not elsewhere classified: Secondary | ICD-10-CM

## 2017-12-28 DIAGNOSIS — G919 Hydrocephalus, unspecified: Secondary | ICD-10-CM

## 2017-12-30 ENCOUNTER — Telehealth: Payer: Self-pay | Admitting: *Deleted

## 2017-12-30 NOTE — Telephone Encounter (Signed)
Spoke with patient and informed him that his CT scan was stable with no new findings when compared to Oct 2018 scan. Advised him the MRI lumbar spine showed multi-level areas of arthritis and degeneration, which are likely causing his leg numbness and weakness. Advised him Dr Marjory LiesPenumalli stated his options include PT, pain management or a spine surgery evaluation. The patient stated he would like to think about his options and call back. He verbalized understanding, appreciation of call.

## 2018-01-13 ENCOUNTER — Other Ambulatory Visit: Payer: Self-pay | Admitting: Radiology

## 2018-01-16 NOTE — Pre-Procedure Instructions (Signed)
Francisco Martin  01/16/2018      [facility] [place] [address] [place] [id number] [place] [id number] Phone: [phone] Fax: [phone]    Your procedure is scheduled on  Monday, January 23, 2018  [facility] Admitting at 6:30 A.M.  Call this number if you have problems the morning of surgery:  [phone]   Remember:  Do not eat food or drink liquids after midnight.   Take these medicines the morning of surgery with A SIP OF WATER: Carvediolol (Coreg) Clonidine (Catapres) Clopidogrel (Plavix) Aspirin  STOP your Eliquis 2 days before your procedure per your Doctor's instructions.   7 days prior to surgery STOP taking any Aleve, Naproxen, Ibuprofen, Motrin, Advil, Goody's, BC's, all herbal medications, fish oil, and all vitamins.   Do not wear jewelry.  Do not wear lotions, powders, or perfumes, or deodorant.  Do not shave 48 hours prior to surgery.  Men may shave face and neck.  Do not bring valuables to the hospital.  [facility] is not responsible for any belongings or valuables.  Contacts, dentures or bridgework may not be worn into surgery.  Leave your suitcase in the car.  After surgery it may be brought to your room.  For patients admitted to the hospital, discharge time will be determined by your treatment team.  Patients discharged the day of surgery will not be allowed to drive home.   Special instructions:  [place]- Preparing For Surgery  Before surgery, you can play an important role. Because skin is not sterile, your skin needs to be as free of germs as possible. You can reduce the number of germs on your skin by washing with CHG (chlorahexidine gluconate) Soap before surgery.  CHG is an antiseptic cleaner which kills germs and bonds with the skin to continue killing germs even after washing.  Please do not use if you have an allergy to CHG or antibacterial soaps. If your skin becomes  reddened/irritated stop using the CHG.  Do not shave (including legs and underarms) for at least 48 hours prior to first CHG shower. It is OK to shave your face.  Please follow these instructions carefully.   1. Shower the NIGHT BEFORE SURGERY and the MORNING OF SURGERY with CHG.   2. If you chose to wash your hair, wash your hair first as usual with your normal shampoo.  3. After you shampoo, rinse your hair and body thoroughly to remove the shampoo.  4. Use CHG as you would any other liquid soap. You can apply CHG directly to the skin and wash gently with a scrungie or a clean washcloth.   5. Apply the CHG Soap to your body ONLY FROM THE NECK DOWN.  Do not use on open wounds or open sores. Avoid contact with your eyes, ears, mouth and genitals (private parts). Wash Face and genitals (private parts)  with your normal soap.  6. Wash thoroughly, paying special attention to the area where your surgery will be performed.  7. Thoroughly rinse your body with warm water from the neck down.  8. DO NOT shower/wash with your normal soap after using and rinsing off the CHG Soap.  9. Pat yourself dry with a CLEAN TOWEL.  10. Wear CLEAN PAJAMAS to bed the night before surgery, wear comfortable clothes the morning of surgery  11. Place CLEAN SHEETS on your bed the night of your first shower and DO NOT SLEEP  WITH PETS.    Day of Surgery: Do not apply any deodorants/lotions. Please wear clean clothes to the hospital/surgery center.       Please read over the following fact sheets that you were given.

## 2018-01-16 NOTE — Pre-Procedure Instructions (Signed)
Francisco Martin  01/16/2018      [facility] [place] [address] [place] [id number] [place] [id number] Phone: [phone] Fax: [phone]    Your procedure is scheduled on  Monday 01/23/18  [facility] Admitting at 630 A.M.  Call this number if you have problems the morning of surgery:  [phone]   Remember:  Do not eat food or drink liquids after midnight.  Take these medicines the morning of surgery with A SIP OF WATER - CARVEDILOL (COREG), CLONIDINE,    Do not wear jewelry, make-up or nail polish.  Do not wear lotions, powders, or perfumes, or deodorant.  Do not shave 48 hours prior to surgery.  Men may shave face and neck.  Do not bring valuables to the hospital.  [facility] is not responsible for any belongings or valuables.  Contacts, dentures or bridgework may not be worn into surgery.  Leave your suitcase in the car.  After surgery it may be brought to your room.  For patients admitted to the hospital, discharge time will be determined by your treatment team.  Patients discharged the day of surgery will not be allowed to drive home.   Name and phone number of your driver:    Special instructions:  [place] - Preparing for Surgery  Before surgery, you can play an important role.  Because skin is not sterile, your skin needs to be as free of germs as possible.  You can reduce the number of germs on you skin by washing with CHG (chlorahexidine gluconate) soap before surgery.  CHG is an antiseptic cleaner which kills germs and bonds with the skin to continue killing germs even after washing.  Please DO NOT use if you have an allergy to CHG or antibacterial soaps.  If your skin becomes reddened/irritated stop using the CHG and inform your nurse when you arrive at Short Stay.  Do not shave (including legs and underarms) for at least 48 hours prior to the first CHG shower.  You may shave your face.  Please follow  these instructions carefully:   1.  Shower with CHG Soap the night before surgery and the                                morning of Surgery.  2.  If you choose to wash your hair, wash your hair first as usual with your       normal shampoo.  3.  After you shampoo, rinse your hair and body thoroughly to remove the                      Shampoo.  4.  Use CHG as you would any other liquid soap.  You can apply chg directly       to the skin and wash gently with scrungie or a clean washcloth.  5.  Apply the CHG Soap to your body ONLY FROM THE NECK DOWN.        Do not use on open wounds or open sores.  Avoid contact with your eyes,       ears, mouth and genitals (private parts).  Wash genitals (private parts)       with your normal soap.  6.  Wash thoroughly, paying special attention to the area where your surgery  will be performed.  7.  Thoroughly rinse your body with warm water from the neck down.  8.  DO NOT shower/wash with your normal soap after using and rinsing off       the CHG Soap.  9.  Pat yourself dry with a clean towel.            10.  Wear clean pajamas.            11.  Place clean sheets on your bed the night of your first shower and do not        sleep with pets.  Day of Surgery  Do not apply any lotions/deoderants the morning of surgery.  Please wear clean clothes to the hospital/surgery center.    Please read over the following fact sheets that you were given. Pain Booklet

## 2018-01-17 ENCOUNTER — Encounter (HOSPITAL_COMMUNITY): Payer: Self-pay | Admitting: Vascular Surgery

## 2018-01-17 ENCOUNTER — Encounter (HOSPITAL_COMMUNITY): Payer: Self-pay

## 2018-01-17 ENCOUNTER — Other Ambulatory Visit: Payer: Self-pay

## 2018-01-17 ENCOUNTER — Encounter (HOSPITAL_COMMUNITY)
Admission: RE | Admit: 2018-01-17 | Discharge: 2018-01-17 | Disposition: A | Discharge status: Home / Self Care | Payer: Medicaid Other | Source: Ambulatory Visit | Attending: Interventional Radiology | Admitting: Interventional Radiology

## 2018-01-17 DIAGNOSIS — Z79899 Other long term (current) drug therapy: Secondary | ICD-10-CM | POA: Diagnosis not present

## 2018-01-17 DIAGNOSIS — Z8673 Personal history of transient ischemic attack (TIA), and cerebral infarction without residual deficits: Secondary | ICD-10-CM | POA: Insufficient documentation

## 2018-01-17 DIAGNOSIS — Z01818 Encounter for other preprocedural examination: Secondary | ICD-10-CM | POA: Diagnosis present

## 2018-01-17 DIAGNOSIS — Z7982 Long term (current) use of aspirin: Secondary | ICD-10-CM | POA: Insufficient documentation

## 2018-01-17 DIAGNOSIS — I671 Cerebral aneurysm, nonruptured: Secondary | ICD-10-CM | POA: Insufficient documentation

## 2018-01-17 DIAGNOSIS — E785 Hyperlipidemia, unspecified: Secondary | ICD-10-CM | POA: Diagnosis not present

## 2018-01-17 DIAGNOSIS — I482 Chronic atrial fibrillation: Secondary | ICD-10-CM | POA: Insufficient documentation

## 2018-01-17 DIAGNOSIS — Z01812 Encounter for preprocedural laboratory examination: Secondary | ICD-10-CM | POA: Insufficient documentation

## 2018-01-17 DIAGNOSIS — I110 Hypertensive heart disease with heart failure: Secondary | ICD-10-CM | POA: Diagnosis not present

## 2018-01-17 DIAGNOSIS — M1990 Unspecified osteoarthritis, unspecified site: Secondary | ICD-10-CM | POA: Diagnosis not present

## 2018-01-17 DIAGNOSIS — Z6841 Body Mass Index (BMI) 40.0 and over, adult: Secondary | ICD-10-CM | POA: Diagnosis not present

## 2018-01-17 DIAGNOSIS — Z7901 Long term (current) use of anticoagulants: Secondary | ICD-10-CM | POA: Insufficient documentation

## 2018-01-17 DIAGNOSIS — I509 Heart failure, unspecified: Secondary | ICD-10-CM | POA: Diagnosis not present

## 2018-01-17 DIAGNOSIS — R9431 Abnormal electrocardiogram [ECG] [EKG]: Secondary | ICD-10-CM | POA: Insufficient documentation

## 2018-01-17 HISTORY — DX: Cerebral infarction, unspecified: I63.9

## 2018-01-17 HISTORY — DX: Cardiac arrhythmia, unspecified: I49.9

## 2018-01-17 HISTORY — DX: Unspecified visual loss: H54.7

## 2018-01-17 HISTORY — DX: Unspecified osteoarthritis, unspecified site: M19.90

## 2018-01-17 LAB — BASIC METABOLIC PANEL
ANION GAP: 12 (ref 5–15)
BUN: 21 mg/dL — ABNORMAL HIGH (ref 6–20)
CALCIUM: 9.4 mg/dL (ref 8.9–10.3)
CO2: 24 mmol/L (ref 22–32)
Chloride: 100 mmol/L — ABNORMAL LOW (ref 101–111)
Creatinine, Ser: 1.2 mg/dL (ref 0.61–1.24)
GFR calc non Af Amer: >60 mL/min (ref >60)
Glucose, Bld: 109 mg/dL — ABNORMAL HIGH (ref 65–99)
Potassium: 4 mmol/L (ref 3.5–5.1)
SODIUM: 136 mmol/L (ref 135–145)

## 2018-01-17 LAB — CBC WITH DIFFERENTIAL/PLATELET
BASOS ABS: 0.1 10*3/uL (ref 0.0–0.1)
BASOS PCT: 1 %
EOS ABS: 0.1 10*3/uL (ref 0.0–0.7)
Eosinophils Relative: 1 %
HEMATOCRIT: 47.5 % (ref 39.0–52.0)
HEMOGLOBIN: 15.3 g/dL (ref 13.0–17.0)
Lymphocytes Relative: 19 %
Lymphs Abs: 1.8 10*3/uL (ref 0.7–4.0)
MCH: 26.6 pg (ref 26.0–34.0)
MCHC: 32.2 g/dL (ref 30.0–36.0)
MCV: 82.5 fL (ref 78.0–100.0)
Monocytes Absolute: 0.8 10*3/uL (ref 0.1–1.0)
Monocytes Relative: 9 %
NEUTROS ABS: 6.6 10*3/uL (ref 1.7–7.7)
NEUTROS PCT: 70 %
Platelets: 282 10*3/uL (ref 150–400)
RBC: 5.76 MIL/uL (ref 4.22–5.81)
RDW: 17 % — ABNORMAL HIGH (ref 11.5–15.5)
WBC: 9.3 10*3/uL (ref 4.0–10.5)

## 2018-01-17 LAB — APTT: aPTT: 32 seconds (ref 24–36)

## 2018-01-17 LAB — PROTIME-INR
INR: 1.18
PROTHROMBIN TIME: 14.9 s (ref 11.4–15.2)

## 2018-01-17 LAB — PLATELET INHIBITION P2Y12: PLATELET FUNCTION P2Y12: 202 [PRU] (ref 194–418)

## 2018-01-17 NOTE — Progress Notes (Signed)
Anesthesia PAT Evaluation: Patient is a 52 year old male scheduled for cerebral arteriogram with possible LICA aneurysm embolization on 01/23/18 by Dr. Julieanne Cotton. He has bilateral ICA aneurysms in the cavernous/paraclinoid regions found during 08/2017 admission for homonymous left hemianopsia.   History includes afib (denied any attempts at DCCV), CHF, HTN, dyslipidemia, arthritis, CVA. BMI is consistent with morbid obesity. - Hospitalized 09/12/17-09/14/17 for homonymous left hemianopsia, likely remergence of previous stroke (remote right PCA and right thalamic infarct on MRA). He has known afib, but INR was subtherapeutic. Work-up revealed no significant ICA stenosis, but bilateral ICA aneurysms. Out-patient follow-up with IR recommended. Out-patient cardiology follow-up recommended due to EF 40-45%, diffuse hypokinesis, and possible severe MR on echo.    - PCP is listed as Dr. Simone Curia at [facility], although now seeing Wenda Low, NP at that same office. She first saw him 09/2017. I spoke with her nurse Glena Norfolk this morning who was able to give me details on where to request cardiology records. I also spoke with Wenda Low this afternoon regarding patient's referral to cardiology.    - Neurologist is Dr. Joycelyn Schmid, last visit 12/09/17.   - Cardiologist was Dr. Bonnielee Haff, previously with [facility] [place], but he is now retired. Patient thought he may not have been seen in nearly 10 years, but when I received records, he was actually last seen on 01/01/15 for follow-up dilated cardiomyopathy, chronic combined systolic and diastolic CHF, chronic atrial fibrillation, dyspnea on exertion. He apparently had been lost to follow-up since 2010, but had gotten re-established following 11/2014 hospitalization for decompensated CHF. Cardioversion was considered, but patient apparently never had. (The other office note I received was from 07/22/09 with Dr.  Belva Crome and stress Cardiolite was recommended and would consider referral for aflutter ablation based on results.)   Meds include Eliquis (to hold starting 01/21/18), ASA 81 mg (started 01/14/18), Plavix (started 01/14/18), clonidine, Lasix, olmesartan-amlodipine-HCTZ, Coreg.   BP 134/88   Pulse 62   Temp 36.8 C   Resp 20   Ht 5\' 9"  (1.753 m)   Wt (!) 310 lb 6.4 oz (140.8 kg)   SpO2 98%   BMI 45.84 kg/m  Patient is a pleasant Caucasian male in NAD. He is morbidly obese. Neck is large. He has chronic generalized BLE edema which has been stable. He reports his venous stasis ulcers from October have healed. He denied chest pain and SOB at rest. He is able to walk 1 miles in ~ 20-25 minutes on a regular basis on relatively flat terrain. He sleeps on 2-3 pillows. He denied prior MI or family history of MI. He denied prior cath, but reports stress test many years ago. Heart irregularly irregular. Heart sounds are mildly distant (morbidly obese), but I did no appreciate any murmurs. Lungs were clear. Mallampati IV. He is missing several teeth, but denied any loose teeth. He has not had anesthesia in the past, but denied any known family history of anesthesia complications.   EKG 01/17/18: Afib at 70 bpm, non-specific T wave abnormality.   Echo 09/12/17: Study Conclusions - Left ventricle: The cavity size was normal. Wall thickness was   increased in a pattern of mild LVH. Indeterminant diastolic   function (atrial fibrillation). Systolic function was mildly to   moderately reduced. The estimated ejection fraction was in the   range of 40% to 45%. Diffuse hypokinesis. - Aortic valve: There was no stenosis. - Aorta: Mildly dilated aortic root. Aortic  root dimension: 39 mm   (ED). - Mitral valve: Mildly calcified annulus. Possibly severe eccentric   mitral regurgitation but not fully visualized. - Left atrium: The atrium was moderately dilated. - Right ventricle: Poorly visualized. - Right  atrium: The atrium was moderately dilated. - Pulmonary arteries: No complete TR doppler jet so unable to   estimate PA systolic pressure. - Systemic veins: IVC measured 2.0 cm with < 50% respirophasic   variation, suggesting RA pressure 8 mmHg. - Pericardium, extracardiac: A trivial pericardial effusion was   identified. Impressions: - Technically difficult study with poor acoustic windows. The   patient was in atrial fibrillation. Normal LV size with mild LV   hypertrophy. EF 40-45% with diffuse hypokinesis. The RV was   poorly visualized. There was possibly severe mitral regurgitation   but it was not fully visualized. Would consider TEE to further   assess. Comparison echocardiograms:  - 12/09/14 [facility]): EF 40-45$, trace TR, RVSP 29 mmHg. Normal appearing MV with no MR. - 07/10/09 [facility]): Cardiomyopathy. EF 35-40%. Mild LAE. Mild MR/TR.   NM Cardia MUGA Rest 07/10/09:  Findings: The cardiac chambers and great vessels are unremarkable.  The calculated left ventricular ejection fraction at rest is 25%.   Carotid U/S 09/13/17: Final Interpretation: - Right Carotid: There is no evicence of stenosis in the right ICA. - Left Carotid: There is evidence in the left ICA of a 1-39% stenosis. - Vertebrals: Both vertebral arteries were patent with antegrade flow. -Subclavians: Normal flow hemodynamics were seen in bilateral subclavian arteries.  BLE arterial Dopplers/ABI 09/13/17: Summary: ABIs and Doppler waveforms are within normal limits bilaterally at rest.  CT head wo contrast 12/28/17: IMPRESSION:  Abnormal CT head (without) demonstrating 1. Absent septum pellucidum. Moderate lateral ventriculomegaly, mild third ventricular enlargement, and normal fourth ventricle. Findings may be due to congenital etiology.  2. Chronic right posterior cerebral artery ischemic infarction affecting right thalamus and right mesial occipital lobe. 3. No change from CT on  09/12/17.  MRI/MRA head 09/12/17: IMPRESSION: MRI HEAD IMPRESSION: 1. No acute intracranial abnormality identified. 2. Remote right PCA territory infarcts involving the right thalamus and right occipital lobe. 3. Lateral ventriculomegaly with absence of the septum pellucidum. 4. Mild chronic small vessel ischemic disease. MRA HEAD IMPRESSION: 1. Negative intracranial MRA for large vessel occlusion. No high-grade or correctable stenosis. 2. Bilateral cavernous ICA aneurysms as above, left larger than right. (Approximate 8 x 4 mm focal outpouching extending from the cavernous left ICA and consistent with aneurysm (series 4, image 66). This extends posteriorly and slightly laterally, and demonstrates a fairly wide neck. An additional 2 mm focal outpouching extending laterally from the cavernous right ICA also suspicious for possible small aneurysm (series 4, image 37). ICA termini widely patent.)  1V CXR 09/12/17: IMPRESSION: Prominent cardiopericardial silhouette consistent with cardiomegaly and/or pericardial effusion. Lungs clear.  Labs 01/17/18: BUN 21, Cr 1.20. Glucose 109. H/H 15.3/47.5, PLT 282. PT/PTT WNL.  P2y12 202 (just started Plavix 01/14/18). A1c on 09/12/17 was 5.7.  Reviewed above with anesthesiologist Dr. Val Eaglehristopher Moser. Recommend preoperative cardiology evaluation prior to procedure given abnormal echo findings and overdue cardiology follow-up. Patient is aware. [facility] practice [facility]. He would be considered a new patient, and they are unable to see him until 01/30/18. Dr. Tomie Chinaevankar is currently practicing with [facility] and is able to see patient on 01/19/18.   I notified Victorino DikeJennifer in IR of patient's elevated p2y12 (anticipates that  Dr. Corliss Skains will repeat on the morning of planned procedure) and that patient will need to see cardiology prior to procedure. Appointment currently scheduled with Dr. Tomie China  on 01/19/18. Chart will be left for follow-up recommendations.  Velna Ochs [facility] Short Stay Center/Anesthesiology Phone [phone] 01/17/2018 5:50 PM

## 2018-01-17 NOTE — Progress Notes (Addendum)
PCP: Dr. Devra DoppKeung Lee--Clay, sees yearly, last Nov 2018 Cardiologist: Dr. Wille GlaserWallmeyer, pt report seeing approx 10 years ago. Prior to that, he was with Dr. Dulce SellarMunley  EKG: Repeat today per Orvan SeenAllison, PA-C with anesthesia ECHO: 08/2017 Stress Test: 2010 Cardiac Cath: Denies  Pt reports starting Plavix and ASA Jan 14, 2018 for procedure.  Pt reports instructions to Stop Eliquis January 21, 2018.   Patient denies shortness of breath, fever, cough, and chest pain at PAT appointment.  Patient verbalized understanding of instructions provided today at the PAT appointment.  Patient asked to review instructions at home and day of surgery.   Revonda StandardAllison PA-C called to see pt during PAT visit.

## 2018-01-18 ENCOUNTER — Encounter: Payer: Self-pay | Admitting: Cardiology

## 2018-01-18 DIAGNOSIS — I429 Cardiomyopathy, unspecified: Secondary | ICD-10-CM | POA: Insufficient documentation

## 2018-01-18 DIAGNOSIS — I509 Heart failure, unspecified: Secondary | ICD-10-CM | POA: Insufficient documentation

## 2018-01-18 DIAGNOSIS — I4892 Unspecified atrial flutter: Secondary | ICD-10-CM | POA: Insufficient documentation

## 2018-01-19 ENCOUNTER — Other Ambulatory Visit: Payer: Self-pay | Admitting: Radiology

## 2018-01-19 ENCOUNTER — Encounter: Payer: Self-pay | Admitting: Cardiology

## 2018-01-19 ENCOUNTER — Ambulatory Visit (INDEPENDENT_AMBULATORY_CARE_PROVIDER_SITE_OTHER): Payer: Commercial Managed Care - PPO | Admitting: Cardiology

## 2018-01-19 VITALS — BP 132/80 | HR 86 | Ht 69.0 in | Wt 312.4 lb

## 2018-01-19 DIAGNOSIS — I671 Cerebral aneurysm, nonruptured: Secondary | ICD-10-CM

## 2018-01-19 DIAGNOSIS — I10 Essential (primary) hypertension: Secondary | ICD-10-CM

## 2018-01-19 DIAGNOSIS — I480 Paroxysmal atrial fibrillation: Secondary | ICD-10-CM

## 2018-01-19 DIAGNOSIS — I509 Heart failure, unspecified: Secondary | ICD-10-CM

## 2018-01-19 DIAGNOSIS — Z01818 Encounter for other preprocedural examination: Secondary | ICD-10-CM | POA: Diagnosis not present

## 2018-01-19 DIAGNOSIS — I429 Cardiomyopathy, unspecified: Secondary | ICD-10-CM

## 2018-01-19 DIAGNOSIS — I4892 Unspecified atrial flutter: Secondary | ICD-10-CM | POA: Diagnosis not present

## 2018-01-19 NOTE — Progress Notes (Signed)
Cardiology Office Note:    Date:  01/19/2018   ID:  Francisco Martin, DOB 1966/09/30, MRN [id number]  PCP:  Simone Curia, MD  Cardiologist:  Garwin Brothers, MD   Referring MD: Simone Curia, MD    ASSESSMENT:    1. Pre-operative clearance   2. Congestive heart failure, unspecified HF chronicity, unspecified heart failure type (HCC)   3. Atrial flutter, unspecified type (HCC)   4. Cardiomyopathy, unspecified type (HCC)   5. Paroxysmal atrial fibrillation (HCC)   6. Essential hypertension   7. Cerebral aneurysm without rupture    PLAN:    In order of problems listed above:  1. Secondary prevention stressed with the patient.  Importance of compliance with diet and medications stressed and he vocalized understanding. 2. I discussed with the patient atrial fibrillation, disease process. Management and therapy including rate and rhythm control, anticoagulation benefits and potential risks were discussed extensively with the patient. Patient had multiple questions which were answered to patient's satisfaction. 3. His blood pressure stable.  Lipids are managed by his primary care physician.  In view of history of aneurysm I think she is a candidate for evaluation of lipids for statin therapy.  I would leave it up to his primary care physician for the same. 4. Patient will undergo Lexiscan sestamibi testing to assess him objectively from a preoperative standpoint.  If this is negative then he is not at high risk for coronary events during the aforementioned surgery.  Meticulous hemodynamic monitoring will further reduce the risk of coronary events. 5. His echocardiogram was abnormal with reduced ejection fraction which is well known for this patient.  Also there was an issue of significant mitral regurgitation which we will revisit when he sees me after his cerebrovascular issues are settled. 6. Patient will be seen in follow-up appointment in 6 months or earlier if the patient has any  concerns    Medication Adjustments/Labs and Tests Ordered: Current medicines are reviewed at length with the patient today.  Concerns regarding medicines are outlined above.  Orders Placed This Encounter  Procedures  . MYOCARDIAL PERFUSION IMAGING   No orders of the defined types were placed in this encounter.    History of Present Illness:    Francisco Martin is a 52 y.o. male who is being seen today for the evaluation of  Preoperative risk stratification from a cardiovascular standpoint at the request of Simone Curia, MD.  Patient is a pleasant 52 year old male with past medical history of essential hypertension, dyslipidemia, cardiomyopathy and atrial fibrillation.  He is on anticoagulation for the same.  He mentions to me that he was found to have 2 cerebral aneurysms and is planning to undergo surgery for them.  For this reason he is here for preop risk assessment.  His anticoagulation for atrial fibrillation from a perioperative standpoint is handled by his surgeons.  He denies any chest pain orthopnea or PND.  He overall leads a sedentary lifestyle.  At the time of my evaluation, the patient is alert awake oriented and in no distress.  Past Medical History:  Diagnosis Date  . Arthritis   . Atrial fibrillation (HCC)   . CHF (congestive heart failure) (HCC)   . CVA (cerebral vascular accident) (HCC)   . Diabetic neuropathy (HCC)   . Dyslipidemia   . Dysrhythmia   . Gout   . Hypertension   . Loss of vision    Left eye  . Malignant hypertension   . Obstructive sleep apnea   .  Stroke [facility])     Past Surgical History:  Procedure Laterality Date  . IR RADIOLOGIST EVAL & MGMT  12/15/2017    Current Medications: Current Meds  Medication Sig  . apixaban (ELIQUIS) 5 MG TABS tablet Take 5 mg by mouth 2 (two) times daily.  . carvedilol (COREG) 3.125 MG tablet Take 1 tablet (3.125 mg total) by mouth 2 (two) times daily. (Patient taking differently: Take 6.25 mg by mouth 2 (two) times  daily. )  . cefdinir (OMNICEF) 300 MG capsule Take 300 mg by mouth daily.  . cloNIDine (CATAPRES) 0.1 MG tablet Take 0.1 mg by mouth 3 (three) times daily.  . clopidogrel (PLAVIX) 75 MG tablet Take 75 mg by mouth daily.  . furosemide (LASIX) 40 MG tablet Take 1 tablet (40 mg total) by mouth daily.  . Olmesartan-Amlodipine-HCTZ 40-10-25 MG TABS Take 1 tablet by mouth daily.  . potassium chloride SA (K-DUR,KLOR-CON) 20 MEQ tablet Take 20 mEq by mouth daily.  . rosuvastatin (CRESTOR) 10 MG tablet Take 10 mg by mouth daily.  . [DISCONTINUED] apixaban (ELIQUIS) 5 MG TABS tablet Take 5 mg by mouth 2 (two) times daily.  . [DISCONTINUED] carvedilol (COREG) 3.125 MG tablet Take 3.125 mg by mouth 2 (two) times daily.     Allergies:   Patient has no known allergies.   Social History   Socioeconomic History  . Marital status: Unknown    Spouse name: None  . Number of children: None  . Years of education: None  . Highest education level: None  Social Needs  . Financial resource strain: None  . Food insecurity - worry: None  . Food insecurity - inability: None  . Transportation needs - medical: None  . Transportation needs - non-medical: None  Occupational History  . None  Tobacco Use  . Smoking status: Never Smoker  . Smokeless tobacco: Never Used  Substance and Sexual Activity  . Alcohol use: No  . Drug use: No  . Sexual activity: Not Currently  Other Topics Concern  . None  Social History Narrative   Lives at home with sister.  Occupation: Engineer, petroleum at [employer].  Single.  No children.  Family Medical Hx:  Diabetes and HTN, and Heart problems.     Family History: The patient's family history includes Heart failure in his mother; Hypertension in his father; Lung cancer in his father.  ROS:   Please see the history of present illness.    All other systems reviewed and are negative.  EKGs/Labs/Other Studies Reviewed:    The following studies were reviewed today: I  reviewed my findings on this patient and discussed with him.  EKG reveals sinus rhythm and nonspecific ST-T changes.   Recent Labs: 09/12/2017: ALT 22; B Natriuretic Peptide 405.9; TSH 0.645 01/17/2018: BUN 21; Creatinine, Ser 1.20; Hemoglobin 15.3; Platelets 282; Potassium 4.0; Sodium 136  Recent Lipid Panel    Component Value Date/Time   CHOL 106 09/12/2017 1347   TRIG 59 09/12/2017 1347   HDL 28 (L) 09/12/2017 1347   CHOLHDL 3.8 09/12/2017 1347   VLDL 12 09/12/2017 1347   LDLCALC 66 09/12/2017 1347    Physical Exam:    VS:  BP 132/80 (BP Location: Left Arm, Patient Position: Sitting, Cuff Size: Normal)   Pulse 86   Ht 5\' 9"  (1.753 m)   Wt (!) 312 lb 6.4 oz (141.7 kg)   SpO2 98%   BMI 46.13 kg/m     Wt Readings from Last 3 Encounters:  01/19/18 (!) 312 lb 6.4 oz (141.7 kg)  01/17/18 (!) 310 lb 6.4 oz (140.8 kg)  12/09/17 (!) 314 lb (142.4 kg)     GEN: Patient is in no acute distress HEENT: Normal NECK: No JVD; No carotid bruits LYMPHATICS: No lymphadenopathy CARDIAC: S1 S2 regular, 2/6 systolic murmur at the apex. RESPIRATORY:  Clear to auscultation without rales, wheezing or rhonchi  ABDOMEN: Soft, non-tender, non-distended MUSCULOSKELETAL:  No edema; No deformity  SKIN: Warm and dry NEUROLOGIC:  Alert and oriented x 3 PSYCHIATRIC:  Normal affect    Signed, Garwin Brothersajan R Revankar, MD  01/19/2018 11:13 AM    [facility]

## 2018-01-19 NOTE — Patient Instructions (Signed)
Medication Instructions:  Your physician recommends that you continue on your current medications as directed. Please refer to the Current Medication list given to you today.  Labwork: None  Testing/Procedures: Your physician has requested that you have a lexiscan myoview. For further information please visit [email or web address]. Please follow instruction sheet, as given.  Follow-Up: Your physician recommends that you schedule a follow-up appointment in: 5 weeks  Any Other Special Instructions Will Be Listed Below (If Applicable).     If you need a refill on your cardiac medications before your next appointment, please call your pharmacy.   [facility]  Garey HamAshley A, RN, BSN

## 2018-01-20 ENCOUNTER — Telehealth: Payer: Self-pay

## 2018-01-20 NOTE — Telephone Encounter (Signed)
Informed patient of appointment date and time at [facility]. Instructions for testing also provided.

## 2018-01-23 ENCOUNTER — Ambulatory Visit (HOSPITAL_COMMUNITY): Admission: RE | Admit: 2018-01-23 | Payer: Commercial Managed Care - PPO | Source: Ambulatory Visit

## 2018-01-23 ENCOUNTER — Encounter (HOSPITAL_COMMUNITY): Admission: RE | Payer: Self-pay | Source: Ambulatory Visit

## 2018-01-23 ENCOUNTER — Ambulatory Visit (HOSPITAL_COMMUNITY)
Admission: RE | Admit: 2018-01-23 | Payer: Commercial Managed Care - PPO | Source: Ambulatory Visit | Admitting: Interventional Radiology

## 2018-01-23 ENCOUNTER — Encounter (HOSPITAL_COMMUNITY): Payer: Self-pay

## 2018-01-23 SURGERY — RADIOLOGY WITH ANESTHESIA
Anesthesia: General

## 2018-01-25 DIAGNOSIS — I509 Heart failure, unspecified: Secondary | ICD-10-CM | POA: Diagnosis not present

## 2018-01-25 DIAGNOSIS — Z01818 Encounter for other preprocedural examination: Secondary | ICD-10-CM | POA: Diagnosis not present

## 2018-01-26 ENCOUNTER — Telehealth: Payer: Self-pay

## 2018-01-26 NOTE — Telephone Encounter (Signed)
[facility]

## 2018-01-26 NOTE — Telephone Encounter (Deleted)
Called pt to let them know about there results and pt has been notified.

## 2018-02-07 ENCOUNTER — Telehealth (HOSPITAL_COMMUNITY): Payer: Self-pay | Admitting: Radiology

## 2018-02-07 NOTE — Telephone Encounter (Signed)
Called pt, no answer and no VM. Will try again. JM

## 2018-02-21 NOTE — Pre-Procedure Instructions (Signed)
Francisco Martin  02/21/2018      [facility] - [address], [place] - [address] [place] [address] Phone: [phone] Fax: [phone]     Your procedure is scheduled on February 27, 2018.  Report to [facility] Admitting at 630 AM.  Call this number if you have problems the morning of surgery:  [phone]   Remember:  Do not eat food or drink liquids after midnight.  Take these medicines the morning of surgery with A SIP OF WATER carvedilol (coreg), clopidogrel (plavix), clonidine (catapres), aspirin.  Continue taking aspirin and plavix and take the morning of surgery per your surgeon's instructions.   Stop/continue eliquis per your surgeon's instructions.  Beginning now, STOP taking any Aleve, Naproxen, Ibuprofen, Motrin, Advil, Goody's, BC's, all herbal medications, fish oil, and all vitamins  Continue all other medications as instructed by your physician except follow the above medication instructions before surgery   Do not wear jewelry.  Do not wear lotions, powders, or colognes, or deodorant.             Men may shave face and neck.  Do not bring valuables to the hospital.  [facility] is not responsible for any belongings or valuables.  Contacts, dentures or bridgework may not be worn into surgery.  Leave your suitcase in the car.  After surgery it may be brought to your room.  For patients admitted to the hospital, discharge time will be determined by your treatment team.  Patients discharged the day of surgery will not be allowed to drive home.   [facility]- Preparing For Surgery  Before surgery, you can play an important role. Because skin is not sterile, your skin needs to be as free of germs as possible. You can reduce the number of germs on your skin by washing with CHG (chlorahexidine gluconate) Soap before surgery.  CHG is an antiseptic cleaner which kills germs and bonds with the skin to continue killing germs  even after washing.  Please do not use if you have an allergy to CHG or antibacterial soaps. If your skin becomes reddened/irritated stop using the CHG.  Do not shave (including legs and underarms) for at least 48 hours prior to first CHG shower. It is OK to shave your face.  Please follow these instructions carefully.   1. Shower the NIGHT BEFORE SURGERY and the MORNING OF SURGERY with CHG.   2. If you chose to wash your hair, wash your hair first as usual with your normal shampoo.  3. After you shampoo, rinse your hair and body thoroughly to remove the shampoo.  4. Use CHG as you would any other liquid soap. You can apply CHG directly to the skin and wash gently with a scrungie or a clean washcloth.   5. Apply the CHG Soap to your body ONLY FROM THE NECK DOWN.  Do not use on open wounds or open sores. Avoid contact with your eyes, ears, mouth and genitals (private parts). Wash Face and genitals (private parts)  with your normal soap.  6. Wash thoroughly, paying special attention to the area where your surgery will be performed.  7. Thoroughly rinse your body with warm water from the neck down.  8. DO NOT shower/wash with your normal soap after using and rinsing off the CHG Soap.  9. Pat yourself dry with a CLEAN TOWEL.  10. Wear CLEAN PAJAMAS to bed the night before surgery, wear comfortable  clothes the morning of surgery  11. Place CLEAN SHEETS on your bed the night of your first shower and DO NOT SLEEP WITH PETS.  Day of Surgery: Do not apply any deodorants/lotions. Please wear clean clothes to the hospital/surgery center.     Please read over the following fact sheets that you were given. Pain Booklet, Coughing and Deep Breathing and Surgical Site Infection Prevention

## 2018-02-22 ENCOUNTER — Encounter (HOSPITAL_COMMUNITY): Payer: Self-pay

## 2018-02-22 ENCOUNTER — Other Ambulatory Visit: Payer: Self-pay | Admitting: Student

## 2018-02-22 ENCOUNTER — Telehealth: Payer: Self-pay | Admitting: Student

## 2018-02-22 ENCOUNTER — Encounter (HOSPITAL_COMMUNITY)
Admission: RE | Admit: 2018-02-22 | Discharge: 2018-02-22 | Disposition: A | Discharge status: Home / Self Care | Payer: Medicaid Other | Source: Ambulatory Visit | Attending: Interventional Radiology | Admitting: Interventional Radiology

## 2018-02-22 ENCOUNTER — Other Ambulatory Visit: Payer: Self-pay

## 2018-02-22 DIAGNOSIS — I509 Heart failure, unspecified: Secondary | ICD-10-CM | POA: Insufficient documentation

## 2018-02-22 DIAGNOSIS — I110 Hypertensive heart disease with heart failure: Secondary | ICD-10-CM | POA: Insufficient documentation

## 2018-02-22 DIAGNOSIS — I4891 Unspecified atrial fibrillation: Secondary | ICD-10-CM | POA: Insufficient documentation

## 2018-02-22 DIAGNOSIS — Z01812 Encounter for preprocedural laboratory examination: Secondary | ICD-10-CM | POA: Insufficient documentation

## 2018-02-22 DIAGNOSIS — Z8673 Personal history of transient ischemic attack (TIA), and cerebral infarction without residual deficits: Secondary | ICD-10-CM | POA: Insufficient documentation

## 2018-02-22 HISTORY — DX: Headache, unspecified: R51.9

## 2018-02-22 HISTORY — DX: Headache: R51

## 2018-02-22 LAB — CBC WITH DIFFERENTIAL/PLATELET
BASOS ABS: 0.1 10*3/uL (ref 0.0–0.1)
Basophils Relative: 1 %
Eosinophils Absolute: 0.1 10*3/uL (ref 0.0–0.7)
Eosinophils Relative: 1 %
HCT: 44.8 % (ref 39.0–52.0)
HEMOGLOBIN: 14.5 g/dL (ref 13.0–17.0)
LYMPHS ABS: 1.6 10*3/uL (ref 0.7–4.0)
LYMPHS PCT: 18 %
MCH: 27.3 pg (ref 26.0–34.0)
MCHC: 32.4 g/dL (ref 30.0–36.0)
MCV: 84.4 fL (ref 78.0–100.0)
Monocytes Absolute: 0.6 10*3/uL (ref 0.1–1.0)
Monocytes Relative: 6 %
NEUTROS PCT: 74 %
Neutro Abs: 7 10*3/uL (ref 1.7–7.7)
PLATELETS: 218 10*3/uL (ref 150–400)
RBC: 5.31 MIL/uL (ref 4.22–5.81)
RDW: 16.4 % — ABNORMAL HIGH (ref 11.5–15.5)
WBC: 9.3 10*3/uL (ref 4.0–10.5)

## 2018-02-22 LAB — PROTIME-INR
INR: 1.38
Prothrombin Time: 16.8 seconds — ABNORMAL HIGH (ref 11.4–15.2)

## 2018-02-22 LAB — BASIC METABOLIC PANEL
ANION GAP: 11 (ref 5–15)
BUN: 12 mg/dL (ref 6–20)
CHLORIDE: 106 mmol/L (ref 101–111)
CO2: 20 mmol/L — ABNORMAL LOW (ref 22–32)
Calcium: 8.9 mg/dL (ref 8.9–10.3)
Creatinine, Ser: 1.06 mg/dL (ref 0.61–1.24)
Glucose, Bld: 88 mg/dL (ref 65–99)
Potassium: 3.8 mmol/L (ref 3.5–5.1)
SODIUM: 137 mmol/L (ref 135–145)

## 2018-02-22 LAB — PLATELET INHIBITION P2Y12: Platelet Function  P2Y12: 223 [PRU] (ref 194–418)

## 2018-02-22 NOTE — Progress Notes (Signed)
Went to see patient in pre-admission following phone call with Olegario MessierKathy, Charity fundraiserN. Patient planned for image-guided diagnostic cerebral angiogram with Dr. Corliss Skainseveshwar on 02/27/2018.  Discussed patient's elevated BP. He has hx medication controlled hypertension. Patient denies symptoms at this time including headache, dizziness, and vision changes. Patient states that he did not take one of his BP medications (Olmesartan-Amlodipine-HCTZ) this AM, as he ran out of the medication and the pharmacy cannot refill this medication until they restock. States that he is going to the pharmacy today to pick up this medication. Explained the importance of patient maintaining his BP prior to procedure, and explained that if his BP is elevated 02/27/2018, the procedure might not take place. Instructed patient that if he is unable to pick up his medication from the pharmacy today, he is to see his PCP before procedure 02/27/2018 to get his BP managed. Instructed patient to continue taking all of his BP medications as directed.  Discussed patient's Eliquis use. Patient was seen by his cardiologist, Dr. Tomie Chinaevankar, 01/19/2018 regarding pre-op clearance. Instructed patient that he is to stop taking Eliquis 48 hours prior to procedure. Instructed patient to continue taking Plavix 75 mg once daily and Aspirin 81 mg once daily.  All questions answered and concerns addressed. Patient conveys understanding and agrees with plan.  Waylan Bogalexandra M Louk, PA-C 02/22/2018, 11:35 AM

## 2018-02-22 NOTE — Progress Notes (Signed)
PCP: Dr. Wenda LowAnna Martin @ [facility] in [place] Cardiologist: Dr. Barrie Dunkerevankar  Alley, PA for Dr. Bedelia Personevenshwar, will contact pt. And advise him when to stop eliquis. She was informed of pt;s elevated blood pressure. Stated for pt. To make an appointment with his PCP asap this week. Pt. Stated he was out of the olmesartan-amlodipine-hctz and [facility]. Pt. Only complaint was a headache earlier but now has resolved.

## 2018-02-22 NOTE — Pre-Procedure Instructions (Signed)
Francisco Martin  02/22/2018      [facility] - [address] [place] - [address] [place] [address] Phone: [phone] Fax: [phone]     Your procedure is scheduled on February 27, 2018.  Report to [facility] Admitting at 630 AM.  Call this number if you have problems the morning of surgery:  [phone]   Remember:  Do not eat food or drink liquids after midnight.  Take these medicines the morning of surgery with A SIP OF WATER carvedilol (coreg), clopidogrel (plavix), clonidine (catapres), aspirin.  Continue taking aspirin and plavix and take the morning of surgery per your surgeon's instructions.   Stop/continue eliquis per your surgeon's instructions.  Beginning now, STOP taking any Aleve, Naproxen, Ibuprofen, Motrin, Advil, Goody's, BC's, all herbal medications, fish oil, and all vitamins  Continue all other medications as instructed by your physician except follow the above medication instructions before surgery   Do not wear jewelry.  Do not wear lotions, powders, or colognes, or deodorant.             Men may shave face and neck.  Do not bring valuables to the hospital.  [facility] is not responsible for any belongings or valuables.  Contacts, dentures or bridgework may not be worn into surgery.  Leave your suitcase in the car.  After surgery it may be brought to your room.  For patients admitted to the hospital, discharge time will be determined by your treatment team.  Patients discharged the day of surgery will not be allowed to drive home.   [facility]- Preparing For Surgery  Before surgery, you can play an important role. Because skin is not sterile, your skin needs to be as free of germs as possible. You can reduce the number of germs on your skin by washing with CHG (chlorahexidine gluconate) Soap before surgery.  CHG is an antiseptic cleaner which kills germs and bonds with the skin to continue killing germs  even after washing.  Please do not use if you have an allergy to CHG or antibacterial soaps. If your skin becomes reddened/irritated stop using the CHG.  Do not shave (including legs and underarms) for at least 48 hours prior to first CHG shower. It is OK to shave your face.  Please follow these instructions carefully.   1. Shower the NIGHT BEFORE SURGERY and the MORNING OF SURGERY with CHG.   2. If you chose to wash your hair, wash your hair first as usual with your normal shampoo.  3. After you shampoo, rinse your hair and body thoroughly to remove the shampoo.  4. Use CHG as you would any other liquid soap. You can apply CHG directly to the skin and wash gently with a scrungie or a clean washcloth.   5. Apply the CHG Soap to your body ONLY FROM THE NECK DOWN.  Do not use on open wounds or open sores. Avoid contact with your eyes, ears, mouth and genitals (private parts). Wash Face and genitals (private parts)  with your normal soap.  6. Wash thoroughly, paying special attention to the area where your surgery will be performed.  7. Thoroughly rinse your body with warm water from the neck down.  8. DO NOT shower/wash with your normal soap after using and rinsing off the CHG Soap.  9. Pat yourself dry with a CLEAN TOWEL.  10. Wear CLEAN PAJAMAS to bed the night before surgery, wear comfortable  clothes the morning of surgery  11. Place CLEAN SHEETS on your bed the night of your first shower and DO NOT SLEEP WITH PETS.  Day of Surgery: Do not apply any deodorants/lotions. Please wear clean clothes to the hospital/surgery center.     Please read over the following fact sheets that you were given. Pain Booklet, Coughing and Deep Breathing and Surgical Site Infection Prevention

## 2018-02-22 NOTE — Telephone Encounter (Signed)
Received call from Olegario MessierKathy, RN from pre-admission regarding patient's BP and medications. Patient planned for image-guided diagnostic cerebral angiogram with Dr. Corliss Skainseveshwar on 02/27/2018.  Nurse reports patient's BP elevated today (168/114, then 154/118, then 161/122). Nurse states patient denies symptoms including headache, dizziness, or vision changes.  She also had questions regarding patient's Eliquis use, and when to tell patient to hold Eliquis.  Informed Olegario MessierKathy, RN that I will discuss case with Dr. Corliss Skainseveshwar before proceeding.  Francisco Bogalexandra M Asaph Serena, PA-C 02/22/2018, 11:27 AM

## 2018-02-22 NOTE — Progress Notes (Signed)
   02/22/18 1026  OBSTRUCTIVE SLEEP APNEA  Have you ever been diagnosed with sleep apnea through a sleep study? No  Do you snore loudly (loud enough to be heard through closed doors)?  0  Do you often feel tired, fatigued, or sleepy during the daytime (such as falling asleep during driving or talking to someone)? 0  Has anyone observed you stop breathing during your sleep? 0  Do you have, or are you being treated for high blood pressure? 1  BMI more than 35 kg/m2? 1  Age > 50 (1-yes) 1  Neck circumference greater than:Male 16 inches or larger, Male 17inches or larger? 1  Male Gender (Yes=1) 1  Obstructive Sleep Apnea Score 5  Score 5 or greater  Results sent to PCP

## 2018-02-23 ENCOUNTER — Other Ambulatory Visit: Payer: Self-pay | Admitting: Radiology

## 2018-02-23 ENCOUNTER — Ambulatory Visit: Payer: Commercial Managed Care - PPO | Admitting: Cardiology

## 2018-02-23 ENCOUNTER — Telehealth: Payer: Self-pay | Admitting: Student

## 2018-02-23 NOTE — Telephone Encounter (Signed)
Spoke with patient regarding his P2Y12 result (223 PRU) from 02/22/2018. Patient planned for image-guided diagnostic cerebral angiogram with Dr. Corliss Skainseveshwar on 02/27/2018.  Informed patient of P2Y12 results. Explained that due to this level, per Dr. Corliss Skainseveshwar, we are going to alter his medications. Instructed patient to discontinue Plavix use.  Instructed patient that he is to start a new medication called Brilinta 90 mg which he is to take twice daily. Instructed patient to continue taking Aspirin 81 mg once daily.  Patient asked how he will receive his prescription. Explained to patient that I will call his pharmacy [facility] [place], [place] [phone])) to fill his Brilinta prescription.  All questions answered and concerns addressed. Patient conveys understanding and agrees with plan.  Waylan Bogalexandra M Louk, PA-C 02/23/2018, 4:08 PM

## 2018-02-23 NOTE — Progress Notes (Signed)
Anesthesia Chart Review: Patient is a 52 year old male scheduled for cerebral arteriogram with possible LICA aneurysm embolization on 02/27/18 by Dr. Julieanne Cotton. Procedure was initially scheduled for 01/23/18, but was postponed until he could be re-evaluated by cardiology. He has bilateral ICA aneurysms in the cavernous/paraclinoid regions found during 08/2017 admission for homonymous left hemianopsia.   History includes never smoker, afib (denied any attempts at DCCV or ablation), CHF, HTN, dyslipidemia, arthritis, CVA, left eye vision loss. BMI is consistent with morbid obesity. OSA screening score was 5. - Hospitalized 09/12/17-09/14/17 for homonymous left hemianopsia, likely remergence of previous stroke (remote right PCA and right thalamic infarct on MRA). He has known afib, but INR was subtherapeutic. Work-up revealed no significant ICA stenosis, but bilateral ICA aneurysms. Out-patient follow-up with IR recommended. Out-patient cardiology follow-up recommended due to EF 40-45%, diffuse hypokinesis, and possible severe MR on echo.    - PCP is listed as Dr. Simone Curia at [facility], although now seeing Wenda Low, NP at that same office. She first saw him 09/2017.  - Neurologist is Dr. Joycelyn Schmid, last visit 12/09/17.  - Cardiologist is Dr. Belva Crome, established 01/19/18. Previously he was seen in 2010 and 2016 by Dr. Bonnielee Haff and Dr. Tomie China when with [facility]. At his 01/19/18 visit, Dr. Tomie China recommended a Lexiscan sestamibi prior to IR procedure (which was low risk), but would wait to revisit possible significant MR after his cerebrovascular issues are settled.   Meds include Eliquis (to hold 48 hours before procedure), ASA 81 mg, Coreg, clonidine, Plavix, Lasix, olmesartan-amlodipine-HCTZ. By 02/23/18 note by Elwin Mocha, PA-C, "Instructed patient that he is to stop taking Eliquis 48 hours prior to procedure. Instructed  patient to continue taking Plavix 75 mg once daily and Aspirin 81 mg once daily."   BP (!) 154/118   Pulse 88   Temp 36.8 C (Oral)   Resp 20   Ht 5\' 9"  (1.753 m)   Wt (!) 314 lb 2 oz (142.5 kg)   SpO2 96%   BMI 46.39 kg/m  By notes, BP 168/114, 154/118, and 161/122. Anesthesia APP was not notified, but Elwin Mocha, PA-C with IR was. Reportedly, he had not taken olmesartan-amlodipine-HCTZ, but he was picking up his new prescription on 02/22/18. He was also instructed to follow-up with his PCP prior to his procedure and if BP significantly elevated on the day of surgery then the procedure may be cancelled. Today, patient reported he did not feel well yesterday but is feeling much better today. He did get his olmesartan-amlodipine-HCTZ filled and is planning to stop by his PCP office on 02/24/18 for re-check. According to my 01/17/18 exam, He is morbidly obese. Neck is large. He has chronic generalized BLE edema which has been stable. He reports his venous stasis ulcers from October have healed. He denied chest pain and SOB at rest. He is able to walk 1 miles in ~ 20-25 minutes on a regular basis on relatively flat terrain. He sleeps on 2-3 pillows. Heart irregularly irregular. Heart sounds are mildly distant (morbidly obese), but I did no appreciate any murmurs. Lungs were clear. Mallampati IV. He is missing several teeth, but denied any loose teeth. He has not had anesthesia in the past, but denied any known family history of anesthesia complications. (Anesthiesa APP was not contacted by RN to re-examine at his 02/22/18 PAT visit.)  EKG 01/17/18: Afib at 70 bpm, non-specific T wave abnormality.   Nuclear stress test  01/25/18 [facility]): 1. No reversible ischemic or infarction seen. 2. Normal LV wall motion. 3. LVEF 62%. 4. Non-invasive risk stratification: Low.  Echo 09/12/17: Study Conclusions - Left ventricle: The cavity size was normal. Wall thickness was increased in a pattern of  mild LVH. Indeterminant diastolic function (atrial fibrillation). Systolic function was mildly to moderately reduced. The estimated ejection fraction was in the range of 40% to 45%. Diffuse hypokinesis. - Aortic valve: There was no stenosis. - Aorta: Mildly dilated aortic root. Aortic root dimension: 39 mm (ED). - Mitral valve: Mildly calcified annulus. Possibly severe eccentric mitral regurgitation but not fully visualized. - Left atrium: The atrium was moderately dilated. - Right ventricle: Poorly visualized. - Right atrium: The atrium was moderately dilated. - Pulmonary arteries: No complete TR doppler jet so unable to estimate PA systolic pressure. - Systemic veins: IVC measured 2.0 cm with < 50% respirophasic variation, suggesting RA pressure 8 mmHg. - Pericardium, extracardiac: A trivial pericardial effusion was identified. Impressions: - Technically difficult study with poor acoustic windows. The patient was in atrial fibrillation. Normal LV size with mild LV hypertrophy. EF 40-45% with diffuse hypokinesis. The RV was poorly visualized. There was possibly severe mitral regurgitation but it was not fully visualized. Would consider TEE to further assess. Comparison echocardiograms:  - 12/09/14 [facility]): EF 40-45$, trace TR, RVSP 29 mmHg. Normal appearing MV with no MR. - 07/10/09 [facility]): Cardiomyopathy. EF 35-40%. Mild LAE. Mild MR/TR.  Carotid U/S 09/13/17: Final Interpretation: - Right Carotid: There is no evicence of stenosis in the right ICA. - Left Carotid: There is evidence in the left ICA of a 1-39% stenosis. - Vertebrals: Both vertebral arteries were patent with antegrade flow. -Subclavians: Normal flow hemodynamics were seen in bilateral subclavian arteries.  BLE arterial Dopplers/ABI 09/13/17: Summary: ABIs and Doppler waveforms are within normal limits bilaterally at rest.  CT head wo contrast 12/28/17:  IMPRESSION:  Abnormal CT head (without) demonstrating 1. Absent septum pellucidum. Moderate lateral ventriculomegaly, mild third ventricular enlargement, and normal fourth ventricle. Findings may be due to congenital etiology.  2. Chronic right posterior cerebral artery ischemic infarction affecting right thalamus and right mesial occipital lobe. 3. No change from CT on 09/12/17.  MRI/MRA head 09/12/17: IMPRESSION: MRI HEAD IMPRESSION: 1. No acute intracranial abnormality identified. 2. Remote right PCA territory infarcts involving the right thalamus and right occipital lobe. 3. Lateral ventriculomegaly with absence of the septum pellucidum. 4. Mild chronic small vessel ischemic disease. MRA HEAD IMPRESSION: 1. Negative intracranial MRA for large vessel occlusion. No high-grade or correctable stenosis. 2. Bilateral cavernous ICA aneurysms as above, left larger than right. (Approximate 8 x 4 mm focal outpouching extending from the cavernous left ICA and consistent with aneurysm (series 4, image 66). This extends posteriorly and slightly laterally, and demonstrates a fairly wide neck. An additional 2 mm focal outpouching extending laterally from the cavernous right ICA also suspicious for possible small aneurysm (series 4, image 37). ICA termini widely patent.)  1V CXR 09/12/17: IMPRESSION: Prominent cardiopericardial silhouette consistent with cardiomegaly and/or pericardial effusion. Lungs clear.  Labs 02/22/18: BUN 12, Cr 1.06. Glucose 88. H/H 14.5/44.8, PLT 218. PT 16.8, INR 1.38. PTT on 01/17/18 was 32. P2y12 233 (Plavix started at the end of 12/2107). A1c on 09/12/17 was 5.7.  Discussed perioperative HTN medication instructions with anesthesiologist Dr. Laqueta Linden change at this time. Agree that patient should follow-up with PCP for HTN re-evaluation as a significantly elevated BP could cancel his case. Patient is aware.  I have notified Victorino DikeJennifer at Dr. Fatima Sangereveshwar's office of p2y12 >  150. She will review with Dr. Corliss Skainseveshwar to see if patient requires any new medication instructions and/or repeat p2y12 prior to his procedure.  Velna Ochsllison Rakan Soffer, PA-C [facility] Short Stay [facility]/Anesthesiology Phone [phone] 02/23/2018 3:51 PM

## 2018-02-24 MED ORDER — DEXTROSE 5 % IV SOLN
3.0000 g | INTRAVENOUS | Status: AC
  Administered 2018-02-27: 3 g via INTRAVENOUS
  Filled 2018-02-24: qty 3

## 2018-02-27 ENCOUNTER — Ambulatory Visit (HOSPITAL_COMMUNITY): Payer: Medicaid Other | Admitting: Certified Registered"

## 2018-02-27 ENCOUNTER — Encounter (HOSPITAL_COMMUNITY)
Admission: RE | Disposition: A | Discharge status: Home / Self Care | Payer: Self-pay | Source: Ambulatory Visit | Attending: Interventional Radiology

## 2018-02-27 ENCOUNTER — Encounter (HOSPITAL_COMMUNITY): Payer: Self-pay | Admitting: Surgery

## 2018-02-27 ENCOUNTER — Ambulatory Visit (HOSPITAL_COMMUNITY)
Admission: RE | Admit: 2018-02-27 | Discharge: 2018-02-27 | Disposition: A | Discharge status: Home / Self Care | Payer: Medicaid Other | Source: Ambulatory Visit | Attending: Interventional Radiology | Admitting: Interventional Radiology

## 2018-02-27 ENCOUNTER — Other Ambulatory Visit: Payer: Self-pay

## 2018-02-27 ENCOUNTER — Ambulatory Visit (HOSPITAL_COMMUNITY): Payer: Medicaid Other | Admitting: Emergency Medicine

## 2018-02-27 DIAGNOSIS — I671 Cerebral aneurysm, nonruptured: Secondary | ICD-10-CM

## 2018-02-27 DIAGNOSIS — Z7901 Long term (current) use of anticoagulants: Secondary | ICD-10-CM | POA: Diagnosis not present

## 2018-02-27 DIAGNOSIS — Z8249 Family history of ischemic heart disease and other diseases of the circulatory system: Secondary | ICD-10-CM | POA: Insufficient documentation

## 2018-02-27 DIAGNOSIS — Z7982 Long term (current) use of aspirin: Secondary | ICD-10-CM | POA: Insufficient documentation

## 2018-02-27 DIAGNOSIS — Z7902 Long term (current) use of antithrombotics/antiplatelets: Secondary | ICD-10-CM | POA: Insufficient documentation

## 2018-02-27 DIAGNOSIS — M1990 Unspecified osteoarthritis, unspecified site: Secondary | ICD-10-CM | POA: Insufficient documentation

## 2018-02-27 DIAGNOSIS — I509 Heart failure, unspecified: Secondary | ICD-10-CM | POA: Diagnosis not present

## 2018-02-27 DIAGNOSIS — I110 Hypertensive heart disease with heart failure: Secondary | ICD-10-CM | POA: Insufficient documentation

## 2018-02-27 DIAGNOSIS — M109 Gout, unspecified: Secondary | ICD-10-CM | POA: Diagnosis not present

## 2018-02-27 DIAGNOSIS — I4891 Unspecified atrial fibrillation: Secondary | ICD-10-CM | POA: Diagnosis not present

## 2018-02-27 DIAGNOSIS — E785 Hyperlipidemia, unspecified: Secondary | ICD-10-CM | POA: Insufficient documentation

## 2018-02-27 HISTORY — PX: IR ANGIO VERTEBRAL SEL VERTEBRAL UNI R MOD SED: IMG5368

## 2018-02-27 HISTORY — PX: IR ANGIO INTRA EXTRACRAN SEL COM CAROTID INNOMINATE BILAT MOD SED: IMG5360

## 2018-02-27 HISTORY — PX: IR 3D INDEPENDENT WKST: IMG2385

## 2018-02-27 HISTORY — PX: RADIOLOGY WITH ANESTHESIA: SHX6223

## 2018-02-27 HISTORY — PX: IR ANGIO VERTEBRAL SEL SUBCLAVIAN INNOMINATE UNI L MOD SED: IMG5364

## 2018-02-27 LAB — PLATELET INHIBITION P2Y12: Platelet Function  P2Y12: 8 [PRU] — ABNORMAL LOW (ref 194–418)

## 2018-02-27 SURGERY — RADIOLOGY WITH ANESTHESIA
Anesthesia: Monitor Anesthesia Care

## 2018-02-27 MED ORDER — CLOPIDOGREL BISULFATE 75 MG PO TABS
75.0000 mg | ORAL_TABLET | ORAL | Status: AC
  Administered 2018-02-27: 75 mg via ORAL
  Filled 2018-02-27 (×2): qty 1

## 2018-02-27 MED ORDER — LIDOCAINE HCL 1 % IJ SOLN
INTRAMUSCULAR | Status: AC
  Filled 2018-02-27: qty 20

## 2018-02-27 MED ORDER — MIDAZOLAM HCL 2 MG/2ML IJ SOLN
INTRAMUSCULAR | Status: DC | PRN
  Administered 2018-02-27: 1 mg via INTRAVENOUS

## 2018-02-27 MED ORDER — NIMODIPINE 30 MG PO CAPS
0.0000 mg | ORAL_CAPSULE | ORAL | Status: AC
  Administered 2018-02-27: 30 mg via ORAL
  Filled 2018-02-27 (×2): qty 2

## 2018-02-27 MED ORDER — FENTANYL CITRATE (PF) 250 MCG/5ML IJ SOLN
INTRAMUSCULAR | Status: DC | PRN
  Administered 2018-02-27: 100 ug via INTRAVENOUS

## 2018-02-27 MED ORDER — SODIUM CHLORIDE 0.9 % IV SOLN
INTRAVENOUS | Status: DC
  Administered 2018-02-27: 10:00:00 via INTRAVENOUS

## 2018-02-27 MED ORDER — ASPIRIN EC 325 MG PO TBEC
325.0000 mg | DELAYED_RELEASE_TABLET | ORAL | Status: DC
  Filled 2018-02-27: qty 1

## 2018-02-27 MED ORDER — IOPAMIDOL (ISOVUE-300) INJECTION 61%
INTRAVENOUS | Status: AC
  Administered 2018-02-27: 94 mL
  Filled 2018-02-27: qty 100

## 2018-02-27 MED ORDER — HEPARIN SODIUM (PORCINE) 1000 UNIT/ML IJ SOLN
INTRAMUSCULAR | Status: DC | PRN
  Administered 2018-02-27: 1000 [IU] via INTRAVENOUS

## 2018-02-27 MED ORDER — SODIUM CHLORIDE 0.9 % IV SOLN: INTRAVENOUS | Status: DC

## 2018-02-27 MED ORDER — LIDOCAINE HCL 1 % IJ SOLN
INTRAMUSCULAR | Status: DC | PRN
  Administered 2018-02-27: 10 mL

## 2018-02-27 MED ORDER — ASPIRIN 81 MG PO CHEW
CHEWABLE_TABLET | ORAL | Status: AC
  Administered 2018-02-27: 243 mg
  Filled 2018-02-27: qty 3

## 2018-02-27 MED ORDER — FENTANYL CITRATE (PF) 100 MCG/2ML IJ SOLN
INTRAMUSCULAR | Status: AC
  Filled 2018-02-27: qty 2

## 2018-02-27 MED ORDER — MIDAZOLAM HCL 2 MG/2ML IJ SOLN
INTRAMUSCULAR | Status: AC
  Filled 2018-02-27: qty 2

## 2018-02-27 MED ORDER — IOPAMIDOL (ISOVUE-300) INJECTION 61%
INTRAVENOUS | Status: AC
  Administered 2018-02-27: 50 mL
  Filled 2018-02-27: qty 300

## 2018-02-27 NOTE — Transfer of Care (Signed)
Immediate Anesthesia Transfer of Care Note  Patient: Francisco Martin  Procedure(s) Performed: EMBOLIZATION (N/A )  Patient Location: Short Stay  Anesthesia Type:MAC  Level of Consciousness: awake, alert  and oriented  Airway & Oxygen Therapy: Patient Spontanous Breathing  Post-op Assessment: Report given to RN and Post -op Vital signs reviewed and stable  Post vital signs: Reviewed and stable  Last Vitals:  Vitals Value Taken Time  BP    Temp    Pulse    Resp    SpO2      Last Pain:  Vitals:   02/27/18 0709  TempSrc:   PainSc: 0-No pain      Patients Stated Pain Goal: 5 (09/40/76 8088)  Complications: No apparent anesthesia complications

## 2018-02-27 NOTE — Anesthesia Procedure Notes (Signed)
Procedure Name: MAC Date/Time: 02/27/2018 10:41 AM Performed by: Teressa Lower., CRNA Pre-anesthesia Checklist: Patient identified, Emergency Drugs available, Suction available, Patient being monitored and Timeout performed Patient Re-evaluated:Patient Re-evaluated prior to induction Oxygen Delivery Method: Nasal cannula

## 2018-02-27 NOTE — Sedation Documentation (Signed)
Sheath pulled, vpad applied.

## 2018-02-27 NOTE — Anesthesia Postprocedure Evaluation (Signed)
Anesthesia Post Note  Patient: Alver Sorrow  Procedure(s) Performed: EMBOLIZATION (N/A )     Patient location during evaluation: PACU Anesthesia Type: MAC Level of consciousness: awake and alert Pain management: pain level controlled Vital Signs Assessment: post-procedure vital signs reviewed and stable Respiratory status: spontaneous breathing, nonlabored ventilation, respiratory function stable and patient connected to nasal cannula oxygen Cardiovascular status: stable and blood pressure returned to baseline Postop Assessment: no apparent nausea or vomiting Anesthetic complications: no    Last Vitals:  Vitals:   02/27/18 0915 02/27/18 1009  BP: 121/66 123/82  Pulse:    Resp:    Temp:    SpO2:      Last Pain:  Vitals:   02/27/18 0709  TempSrc:   PainSc: 0-No pain                 Evann Koelzer,JAMES TERRILL

## 2018-02-27 NOTE — H&P (Signed)
Chief Complaint: Patient was seen in consultation today for left internal carotid artery aneurysm embolization at the request of Dr Etheleen MayhewV Penumalli  Supervising Physician: Julieanne Cottoneveshwar, Sanjeev  [facility]  History of Present Illness: Francisco Martin is a 52 y.o. male    Dr Corliss Skainseveshwar note 12/15/2017: HISTORY OF PRESENT ILLNESS: The patient is a 52 year old right-handed gentleman who has been referred for evaluation of management of recently discovered bilateral internal carotid artery aneurysms in the cavernous/paraclinoid regions. The [facility]. Symptoms at that time were visual blurring, with a sensation of feeling of walking in the air. Subsequent workup and examinations revealed the patient had a right posterior cerebral artery ischemic infarct probably chronic in nature. Also discovered at that time was an old lacunar infarct in the right lateral thalamus. The patient subsequently underwent significant workup which also entailed an MRI MRA of the brain. The MRA of the brain revealed the presence of a 8 x 4 mm left internal carotid artery distal cavernous/paraclinoid aneurysm. Also discovered was a laterally positioned right caval cavernous aneurysm. The patient was discharged home with outpatient physical therapy MRI/MRA 08/2017 Distal cervical segments of the internal carotid arteries are widely patent with antegrade flow. Petrous cavernous, and supraclinoid segments widely patent. Approximate 8 x 4 mm focal outpouching extending from the cavernous left ICA and consistent with aneurysm (series 4, image 66). This extends posteriorly and slightly laterally, and demonstrates a fairly wide neck. An additional 2 mm focal outpouching extending laterally from the cavernous right ICA also suspicious for possible small aneurysm (series 4, image 37). ICA termini widely patent.   Nuclear stress test 01/25/18  [facility]): 1. No reversible ischemic or infarction seen. 2. Normal LV wall motion. 3. LVEF 62%. 4. Non-invasive risk stratification: Low.   Scheduled for L ICA aneurysm embolization P2y12  02/22/18: 223 Changed to Brilinta 90 mg BID P2y12 pending today LD Eliquis 02/23/2018  BP today 155/84 last taken  Past Medical History:  Diagnosis Date  . Arthritis   . Atrial fibrillation (HCC)   . CHF (congestive heart failure) (HCC)   . CVA (cerebral vascular accident) (HCC) 08/2017  . Dyslipidemia   . Dysrhythmia   . Gout   . Headache    ocassionally  . Hypertension   . Loss of vision    Left eye  . Malignant hypertension   . Stroke [facility](HCC)     Past Surgical History:  Procedure Laterality Date  . IR RADIOLOGIST EVAL & MGMT  12/15/2017    Allergies: Patient has no known allergies.  Medications: Prior to Admission medications   Medication Sig Start Date End Date Taking? Authorizing Provider  aspirin EC 81 MG tablet Take 81 mg by mouth daily.   Yes [provider]  carvedilol (COREG) 3.125 MG tablet Take 1 tablet (3.125 mg total) by mouth 2 (two) times daily. Patient taking differently: Take 6.25 mg by mouth 2 (two) times daily.  09/14/17  Yes Noralee Stainhoi, Jennifer, DO  cloNIDine (CATAPRES) 0.1 MG tablet Take 0.1 mg by mouth 3 (three) times daily. 07/31/17  Yes [provider]  furosemide (LASIX) 40 MG tablet Take 1 tablet (40 mg total) by mouth daily. 09/14/17  Yes Noralee Stainhoi, Jennifer, DO  Olmesartan-Amlodipine-HCTZ 40-10-25 MG TABS Take 1 tablet by mouth daily. 09/10/17  Yes [provider]  apixaban (ELIQUIS) 5 MG TABS tablet Take 5 mg by mouth 2 (two) times daily.    [provider]  clopidogrel (PLAVIX) 75 MG tablet Take 75 mg by mouth daily.    [provider]     Family History  Problem Relation Age of Onset  . Heart failure Mother   . Lung cancer Father   . Hypertension Father     Social History   Socioeconomic History  .  Marital status: Divorced    Spouse name: Not on file  . Number of children: Not on file  . Years of education: Not on file  . Highest education level: Not on file  Occupational History  . Not on file  Social Needs  . Financial resource strain: Not on file  . Food insecurity:    Worry: Not on file    Inability: Not on file  . Transportation needs:    Medical: Not on file    Non-medical: Not on file  Tobacco Use  . Smoking status: Never Smoker  . Smokeless tobacco: Never Used  Substance and Sexual Activity  . Alcohol use: No  . Drug use: No  . Sexual activity: Not Currently  Lifestyle  . Physical activity:    Days per week: Not on file    Minutes per session: Not on file  . Stress: Not on file  Relationships  . Social connections:    Talks on phone: Not on file    Gets together: Not on file    Attends religious service: Not on file    Active member of club or organization: Not on file    Attends meetings of clubs or organizations: Not on file    Relationship status: Not on file  Other Topics Concern  . Not on file  Social History Narrative   Lives at home with sister.  Occupation: Engineer, petroleum at [employer].  Single.  No children.  Family Medical Hx:  Diabetes and HTN, and Heart problems.    Review of Systems: A 12 point ROS discussed and pertinent positives are indicated in the HPI above.  All other systems are negative.  Review of Systems  Constitutional: Positive for activity change. Negative for appetite change, fatigue and fever.  HENT: Negative for tinnitus and trouble swallowing.   Eyes: Positive for visual disturbance.  Respiratory: Negative for cough, choking and shortness of breath.   Cardiovascular: Negative for chest pain.  Gastrointestinal: Negative for abdominal pain.  Musculoskeletal: Negative for back pain, gait problem and neck stiffness.  Neurological: Positive for light-headedness and headaches. Negative for dizziness, tremors, seizures,  syncope, facial asymmetry, speech difficulty, weakness and numbness.  Psychiatric/Behavioral: Negative for behavioral problems and confusion.    Vital Signs: BP (!) 155/84   Pulse 92   Temp 99.1 F (37.3 C) (Oral)   Resp 20   Wt (!) 314 lb (142.4 kg)   SpO2 100%   BMI 46.37 kg/m   Physical Exam  Constitutional: He is oriented to person, place, and time.  obese  HENT:  Head: Atraumatic.  Eyes: EOM are normal.  Neck: Neck supple.  Cardiovascular: Normal rate and regular rhythm.  Murmur heard. Pulmonary/Chest: Effort normal and breath sounds normal. He has no wheezes.  Abdominal: Soft. Bowel sounds are normal. There is no tenderness.  Musculoskeletal: Normal range of motion.  Neurological: He is alert and oriented to person, place, and time.  Skin: Skin is warm and dry.  Psychiatric: He has a normal mood and affect. His behavior is normal. Judgment and thought content normal.  Nursing note and vitals reviewed.   Imaging: No results found.  Labs:  CBC: Recent Labs    09/12/17 1010 09/12/17 1031 09/12/17 1759 01/17/18 1007 02/22/18 1054  WBC 9.6  --   --  9.3 9.3  HGB 11.6* 13.3  --  15.3 14.5  HCT 38.1* 39.0 37.2* 47.5 44.8  PLT 322  --   --  282 218    COAGS: Recent Labs    09/12/17 1010 09/13/17 0520 09/14/17 0540 01/17/18 1007 02/22/18 1054  INR 1.68 1.36 1.66 1.18 1.38  APTT 35  --   --  32  --     BMP: Recent Labs    09/12/17 1031 09/12/17 1059 01/17/18 1007 02/22/18 1054  NA 135 137 136 137  K 8.3* 3.9 4.0 3.8  CL 102 102 100* 106  CO2  --  29 24 20*  GLUCOSE 107* 106* 109* 88  BUN 23* 14 21* 12  CALCIUM  --  8.6* 9.4 8.9  CREATININE 1.10 1.19 1.20 1.06  GFRNONAA  --  >60 >60 >60  GFRAA  --  >60 >60 >60    LIVER FUNCTION TESTS: Recent Labs    09/12/17 1059  BILITOT 1.5*  AST 31  ALT 22  ALKPHOS 105  PROT 6.3*  ALBUMIN 3.2*    TUMOR MARKERS: No results for input(s): AFPTM, CEA, CA199, CHROMGRNA in the last 8760  hours.  Assessment and Plan:  Left internal carotid artery aneurysm  Scheduled for cerebral arteriogram with possible embolization of L ICA aneurysm Risks and benefits of cerebral angiogram with intervention were discussed with the patient including, but not limited to bleeding, infection, vascular injury, contrast induced renal failure, stroke or even death.  This interventional procedure involves the use of X-rays and because of the nature of the planned procedure, it is possible that we will have prolonged use of X-ray fluoroscopy.  Potential radiation risks to you include (but are not limited to) the following: - A slightly elevated risk for cancer  several years later in life. This risk is typically less than 0.5% percent. This risk is low in comparison to the normal incidence of human cancer, which is 33% for women and 50% for men according to the [employer] Society. - Radiation induced injury can include skin redness, resembling a rash, tissue breakdown / ulcers and hair loss (which can be temporary or permanent).   The likelihood of either of these occurring depends on the difficulty of the procedure and whether you are sensitive to radiation due to previous procedures, disease, or genetic conditions.   IF your procedure requires a prolonged use of radiation, you will be notified and given written instructions for further action.  It is your responsibility to monitor the irradiated area for the 2 weeks following the procedure and to notify your physician if you are concerned that you have suffered a radiation induced injury.    All of the patient's questions were answered, patient is agreeable to proceed. Consent signed and in chart  Pt is aware if intervention is performed- he will be admitted overnight into Neuro ICU Plan for discharge in Am  Thank you for this interesting consult.  I greatly enjoyed meeting Francisco Martin and look forward to participating in their care.  A  copy of this report was sent to the requesting provider on this date.  Electronically Signed: Robet Leu, PA-C 02/27/2018, 8:02 AM   I spent a total of  30 Minutes   in face to face in clinical consultation, greater than 50% of which was  counseling/coordinating care for cerebral arteriogram and poss L ICA aneurysm embolization

## 2018-02-27 NOTE — Procedures (Signed)
S/P 4 vessel cerebral arteriogram. RT CFA approach, .Findings. 1.Approx 6.375mm x 4 mm Lt ICA petrous cavernous aneurysm. 2.Approx 2.7 mm RT ICA sup hypophyseal aneurysm

## 2018-02-27 NOTE — Discharge Instructions (Addendum)
Cerebral Angiogram, Care After °Refer to this sheet in the next few weeks. These instructions provide you with information on caring for yourself after your procedure. Your health care provider may also give you more specific instructions. Your treatment has been planned according to current medical practices, but problems sometimes occur. Call your health care provider if you have any problems or questions after your procedure. °What can I expect after the procedure? °After your procedure, it is typical to have the following: °· Bruising at the catheter insertion site that usually fades within 1-2 weeks. °· Blood collecting in the tissue (hematoma) that may be painful to the touch. It should usually decrease in size and tenderness within 1-2 weeks. °· A mild headache. ° °Follow these instructions at home: °· Take medicines only as directed by your health care provider. °· You may shower 24-48 hours after the procedure or as directed by your health care provider. Remove the bandage (dressing) and gently wash the site with plain soap and water. Pat the area dry with a clean towel. Do not rub the site, because this may cause bleeding. °· Do not take baths, swim, or use a hot tub until your health care provider approves. °· Check your insertion site every day for redness, swelling, or drainage. °· Do not apply powder or lotion to the site. °· Do not lift over 10 lb (4.5 kg) for 5 days after your procedure or as directed by your health care provider. °· Ask your health care provider when it is okay to: °? Return to work or school. °? Resume usual physical activities or sports. °? Resume sexual activity. °· Do not drive home if you are discharged the same day as the procedure. Have someone else drive you. °· You may drive 24 hours after the procedure unless otherwise instructed by your health care provider. °· Do not operate machinery or power tools for 24 hours after the procedure or as directed by your health care  provider. °· If your procedure was done as an outpatient procedure, which means that you went home the same day as your procedure, a responsible adult should be with you for the first 24 hours after you arrive home. °· Keep all follow-up visits as directed by your health care provider. This is important. °Contact a health care provider if: °· You have a fever. °· You have chills. °· You have increased bleeding from the catheter insertion site. Hold pressure on the site. °Get help right away if: °· You have vision changes or loss of vision. °· You have numbness or weakness on one side of your body. °· You have difficulty talking, or you have slurred speech or cannot speak (aphasia). °· You feel confused or have difficulty remembering. °· You have unusual pain at the catheter insertion site. °· You have redness, warmth, or swelling at the catheter insertion site. °· You have drainage (other than a small amount of blood on the dressing) from the catheter insertion site. °· The catheter insertion site is bleeding, and the bleeding does not stop after 30 minutes of holding steady pressure on the site. °These symptoms may represent a serious problem that is an emergency. Do not wait to see if the symptoms will go away. Get medical help right away. Call your local emergency services (911 in [place] Do not drive yourself to the hospital. °This information is not intended to replace advice given to you by your health care provider. Make sure you discuss any questions   you have with your health care provider. °Document Released: 03/25/2014 Document Revised: 04/15/2016 Document Reviewed: 11/21/2013 °Elsevier Interactive Patient Education © 2017 [employer] °Moderate Conscious Sedation, Adult, Care After °These instructions provide you with information about caring for yourself after your procedure. Your health care provider may also give you more specific instructions. Your treatment has been planned according to current  medical practices, but problems sometimes occur. Call your health care provider if you have any problems or questions after your procedure. °What can I expect after the procedure? °After your procedure, it is common: °· To feel sleepy for several hours. °· To feel clumsy and have poor balance for several hours. °· To have poor judgment for several hours. °· To vomit if you eat too soon. ° °Follow these instructions at home: °For at least 24 hours after the procedure: ° °· Do not: °? Participate in activities where you could fall or become injured. °? Drive. °? Use heavy machinery. °? Drink alcohol. °? Take sleeping pills or medicines that cause drowsiness. °? Make important decisions or sign legal documents. °? Take care of children on your own. °· Rest. °Eating and drinking °· Follow the diet recommended by your health care provider. °· If you vomit: °? Drink water, juice, or soup when you can drink without vomiting. °? Make sure you have little or no nausea before eating solid foods. °General instructions °· Have a responsible adult stay with you until you are awake and alert. °· Take over-the-counter and prescription medicines only as told by your health care provider. °· If you smoke, do not smoke without supervision. °· Keep all follow-up visits as told by your health care provider. This is important. °Contact a health care provider if: °· You keep feeling nauseous or you keep vomiting. °· You feel light-headed. °· You develop a rash. °· You have a fever. °Get help right away if: °· You have trouble breathing. °This information is not intended to replace advice given to you by your health care provider. Make sure you discuss any questions you have with your health care provider. °Document Released: 08/29/2013 Document Revised: 04/12/2016 Document Reviewed: 02/28/2016 °Elsevier Interactive Patient Education © 2018 Elsevier Inc. ° °

## 2018-02-27 NOTE — Anesthesia Procedure Notes (Signed)
Arterial Line Insertion Start/End4/06/2018 10:00 AM, 02/27/2018 10:02 AM Performed by: Izola Priceockfield, Sherilee Smotherman Walton Jr., CRNA, CRNA  Patient location: Pre-op. Preanesthetic checklist: patient identified, IV checked, site marked, risks and benefits discussed, surgical consent, monitors and equipment checked, pre-op evaluation, timeout performed and anesthesia consent Lidocaine 1% used for infiltration Left, radial was placed Catheter size: 20 G Hand hygiene performed , maximum sterile barriers used  and Seldinger technique used Allen's test indicative of satisfactory collateral circulation Attempts: 1 Procedure performed without using ultrasound guided technique. Following insertion, dressing applied and Biopatch. Post procedure assessment: normal and unchanged  Patient tolerated the procedure well with no immediate complications.

## 2018-02-27 NOTE — Anesthesia Preprocedure Evaluation (Addendum)
Anesthesia Evaluation  Patient identified by MRN, date of birth, ID band Patient awake    Reviewed: Allergy & Precautions, NPO status , Patient's Chart, lab work & pertinent test results  Airway Mallampati: II  TM Distance: <3 FB Neck ROM: Full    Dental  (+) Poor Dentition, Loose, Missing, Chipped   Pulmonary    breath sounds clear to auscultation       Cardiovascular hypertension, +CHF  + dysrhythmias  Rhythm:Irregular Rate:Normal  Left ventricle: The cavity size was normal. Wall thickness was increased in a pattern of mild LVH. Indeterminant diastolic function (atrial fibrillation). Systolic function was mildly to moderately reduced. The estimated ejection fraction was in the range of 40% to 45%. Diffuse hypokinesis. - Aortic valve: There was no stenosis. - Aorta: Mildly dilated aortic root. Aortic root dimension: 39 mm (ED). - Mitral valve: Mildly calcified annulus. Possibly severe eccentric mitral regurgitation but not fully visualized. - Left atrium: The atrium was moderately dilated. - Right ventricle: Poorly visualized. - Right atrium: The atrium was moderately dilated. - Pulmonary arteries: No complete TR doppler jet so unable to estimate PA systolic pressure. - Systemic veins: IVC measured 2.0 cm with < 50% respirophasic variation, suggesting RA pressure 8 mmHg. - Pericardium, extracardiac: A trivial pericardial effusion was identified. Impressions: - Technically difficult study with poor acoustic windows. The patient was in atrial fibrillation. Normal LV size with mild LV hypertrophy. EF 40-45% with diffuse hypokinesis. The RV was poorly visualized. There was possibly severe mitral regurgitation but it was not fully visualized. Would consider TEE to further assess. Comparison echocardiograms: - 12/09/14 [facility]): EF 40-45$, trace TR, RVSP 29 mmHg. Normal appearing MV with  no MR. -07/10/09 [facility]): Cardiomyopathy. EF 35-40%. Mild LAE. Mild MR/TR.  Carotid U/S 09/13/17:Final Interpretation: -Right Carotid: There is no evicence of stenosis in the right ICA. -Left Carotid: There is evidence in the left ICA of a 1-39% stenosis. -Vertebrals: Both vertebral arteries were patent with antegrade flow. -Subclavians: Normal flow hemodynamics were seen in bilateral subclavian arteries.  BLE arterial Dopplers/ABI 09/13/17:Summary: ABIs and Doppler waveforms are within normal limits bilaterally at rest.  CT head wo contrast 12/28/17:IMPRESSION:  Abnormal CT head (without) demonstrating 1. Absent septum pellucidum. Moderate lateral ventriculomegaly, mild third ventricular enlargement, and normal fourth ventricle. Findings may be due to congenital etiology.  2. Chronic right posterior cerebral artery ischemic infarction affecting right thalamus and right mesial occipital lobe. 3. No change from CT on 09/12/17.  MRI/MRA head 09/12/17:IMPRESSION:     Neuro/Psych    GI/Hepatic   Endo/Other  diabetes  Renal/GU      Musculoskeletal  (+) Arthritis ,   Abdominal (+) + obese,   Peds  Hematology   Anesthesia Other Findings   Reproductive/Obstetrics                            Anesthesia Physical Anesthesia Plan  ASA: IV  Anesthesia Plan: MAC   Post-op Pain Management:    Induction: Intravenous  PONV Risk Score and Plan: 3  Airway Management Planned: Simple Face Mask  Additional Equipment:   Intra-op Plan:   Post-operative Plan:   Informed Consent: I have reviewed the patients History and Physical, chart, labs and discussed the procedure including the risks, benefits and alternatives for the proposed anesthesia with the patient or authorized representative who has indicated his/her understanding and acceptance.     Plan Discussed with:   Anesthesia Plan Comments:  Anesthesia Quick  Evaluation

## 2018-03-01 ENCOUNTER — Encounter (HOSPITAL_COMMUNITY): Payer: Self-pay | Admitting: Interventional Radiology

## 2018-03-14 ENCOUNTER — Encounter (HOSPITAL_COMMUNITY): Payer: Self-pay | Admitting: Interventional Radiology

## 2018-03-24 ENCOUNTER — Other Ambulatory Visit: Payer: Self-pay

## 2018-04-11 ENCOUNTER — Ambulatory Visit: Payer: Commercial Managed Care - PPO | Admitting: Cardiology

## 2018-04-22 DEATH — deceased

## 2018-06-13 ENCOUNTER — Ambulatory Visit: Payer: Commercial Managed Care - PPO | Admitting: Diagnostic Neuroimaging

## 2018-06-14 ENCOUNTER — Encounter: Payer: Self-pay | Admitting: Diagnostic Neuroimaging

## 2018-07-10 ENCOUNTER — Telehealth: Payer: Self-pay | Admitting: Diagnostic Neuroimaging

## 2018-07-10 NOTE — Telephone Encounter (Signed)
Pt sister on [facility] Florina [place] [phone] has called to inform that on 06-01-2018 pt passed

## 2018-10-13 ENCOUNTER — Telehealth (HOSPITAL_COMMUNITY): Payer: Self-pay | Admitting: Radiology

## 2018-12-08 IMAGING — MR MR HEAD W/O CM
9 of 11 series · 29 of 48 positions shown · non-contrast
Comparison: Prior CT from earlier the same day.

CLINICAL DATA: Initial evaluation for acute bilateral leg numbness.

EXAM:
MRI HEAD WITHOUT CONTRAST
MRA HEAD WITHOUT CONTRAST
TECHNIQUE: Multiplanar, multiecho pulse sequences of the brain and surrounding
structures were obtained without intravenous contrast. Angiographic
images of the head were obtained using MRA technique without
contrast.

[Series 3: DWI · axial · 3.0mm · 1.02mm/px · z∈[-62,+106]mm · 6 of 114 slices shown (1 of 2)]
[im 1/114]
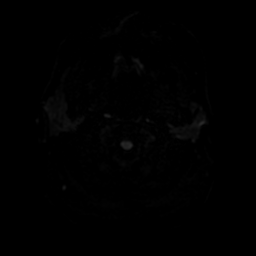
[im 23/114]
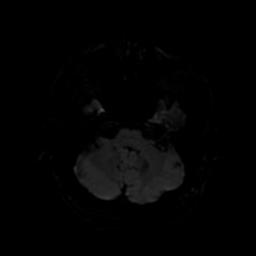
[im 46/114]
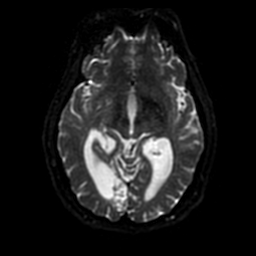
[im 68/114]
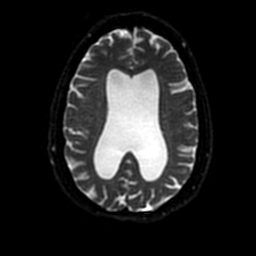
[im 91/114]
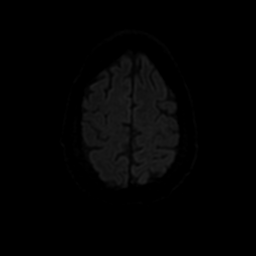
[im 114/114]
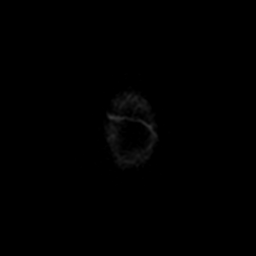

[Series 4: ax (id) 2 · axial · 1.0mm · 0.43mm/px · z∈[-42,-13]mm · 3 of 176 slices shown]
[im 1/176]
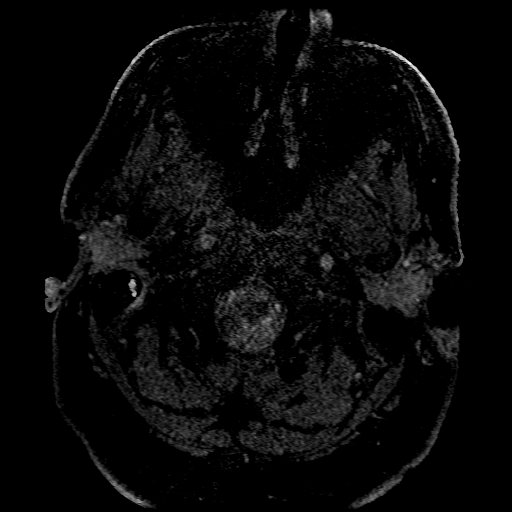
[im 20/176]
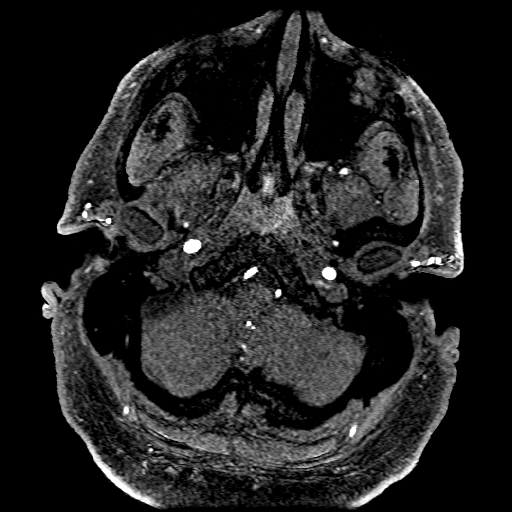
[im 59/176]
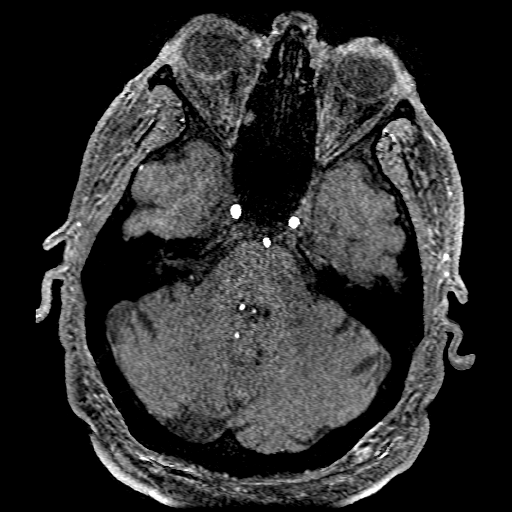

[Series 5: DWI · coronal · 4.0mm · 0.94mm/px · 5 of 82 slices shown (2 of 2)]
[im 1/82]
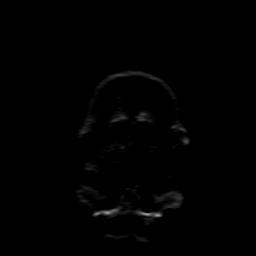
[im 21/82]
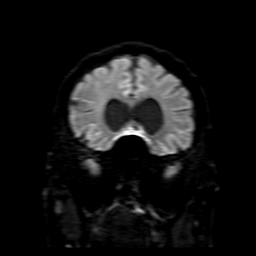
[im 41/82]
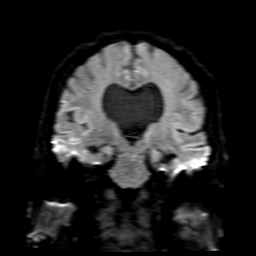
[im 61/82]
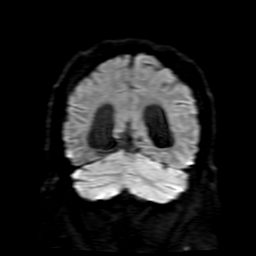
[im 82/82]
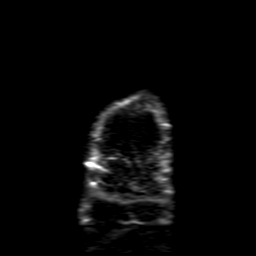

[Series 7: T2 · axial · 5.0mm · 0.51mm/px · z∈[-57,+116]mm · 2 of 30 slices shown (1 of 2)]
[im 1/30]
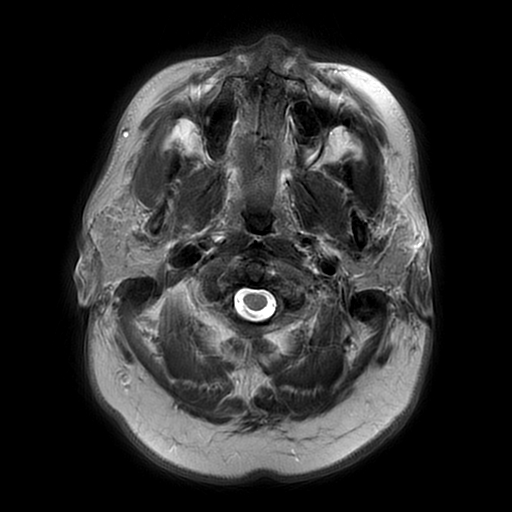
[im 30/30]
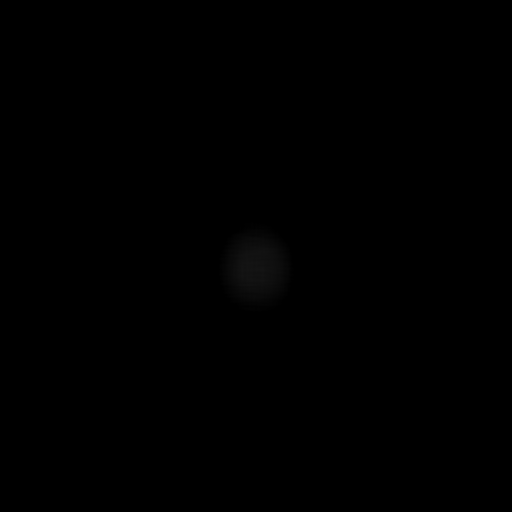

[Series 8: FLAIR · axial · 5.0mm · 0.51mm/px · z∈[-57,+116]mm · 2 of 30 slices shown (1 of 2)]
[im 1/30]
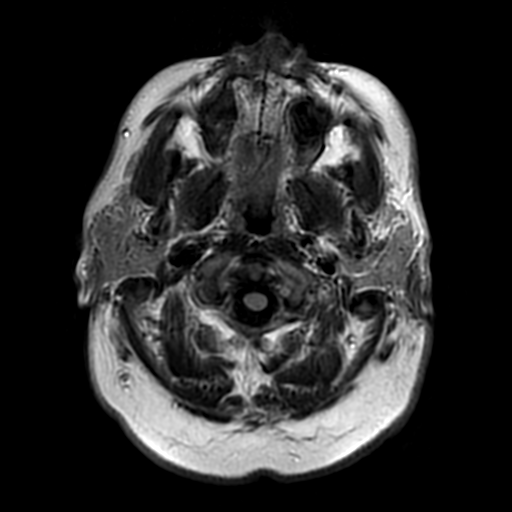
[im 30/30]
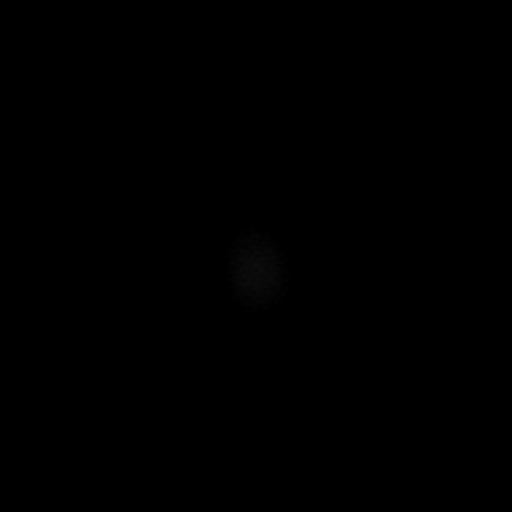

[Series 11: FLAIR · sagittal · 5.0mm · 0.51mm/px · 2 of 24 slices shown (2 of 2)]
[im 1/24]
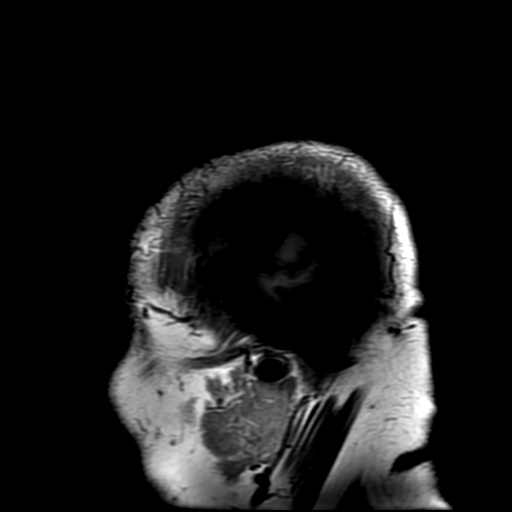
[im 24/24]
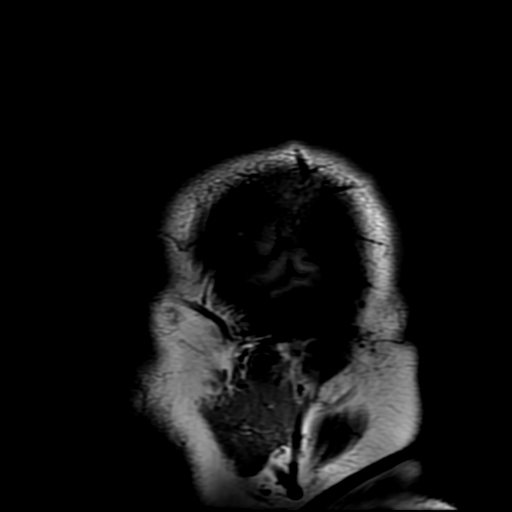

[Series 12: T2 · coronal · 5.0mm · 0.47mm/px · 2 of 34 slices shown (2 of 2)]
[im 1/34]
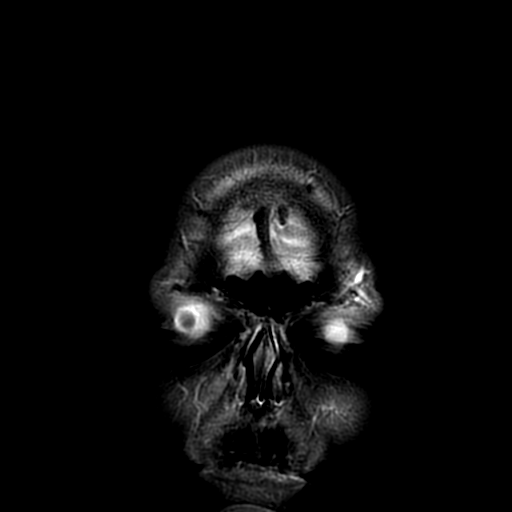
[im 34/34]
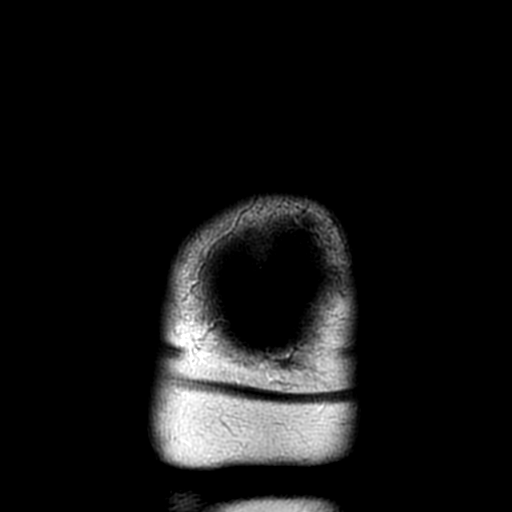

[Series 350: ADC · axial · 3.0mm · 1.02mm/px · z∈[-62,+106]mm · 4 of 57 slices shown (1 of 2)]
[im 1/57]
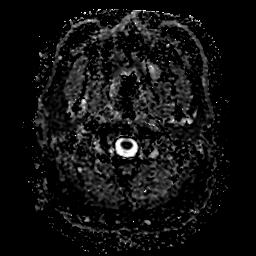
[im 19/57]
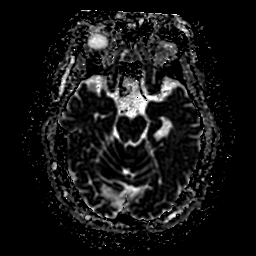
[im 38/57]
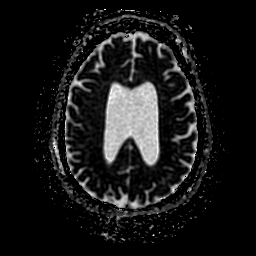
[im 57/57]
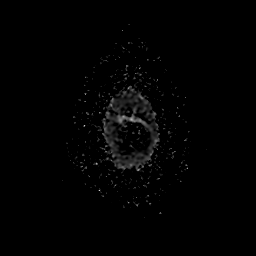

[Series 550: ADC · coronal · 4.0mm · 0.94mm/px · 3 of 41 slices shown (2 of 2)]
[im 1/41]
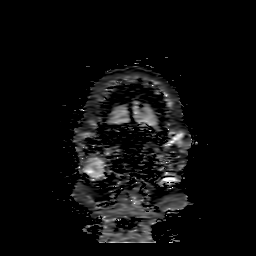
[im 21/41]
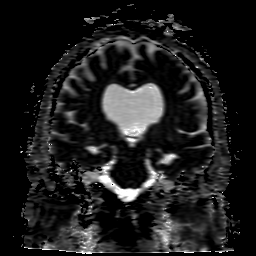
[im 41/41]
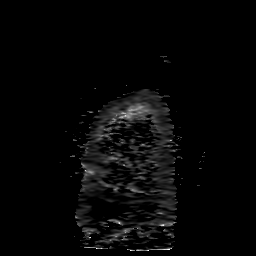

[29 of 48 positions shown; findings below may reference images not displayed]

FINDINGS: MRI HEAD FINDINGS

Brain: The generalized age related cerebral atrophy. Mild patchy
T2/FLAIR hyperintensity within the periventricular white matter most
consistent with chronic small vessel ischemic disease, mild nature.
Small remote lacunar infarct present within the right thalamus.
Remote right PCA territory infarct present within the parasagittal
right occipital lobe. Associated chronic hemorrhagic blood products.

No abnormal foci of restricted diffusion to suggest acute or
subacute ischemia. Gray-white matter differentiation otherwise
maintained. No other areas of chronic infarction. No evidence for
acute intracranial hemorrhage.

No mass lesion, midline shift, or mass effect. Lateral
ventriculomegaly with absence of the septum pellucidum again noted.
Third and fourth ventricular size normal. No extra-axial fluid
collection. Major dural sinuses grossly patent.

Pituitary gland suprasellar region normal. Midline structures intact
and normal.

Vascular: Major intracranial vascular flow voids are maintained.

Skull and upper cervical spine: Craniocervical junction normal.
Upper cervical spine within normal limits. Bone marrow signal
intensity normal. No scalp soft tissue abnormality.

Sinuses/Orbits: Globes and orbital soft tissues within normal
limits.

Other: Mild mucosal thickening within the left maxillary sinus.
Paranasal sinuses otherwise clear. Trace fluid within the inferior
right mastoid air cells, of doubtful significance. Mastoids
otherwise clear. Inner ear structures normal.

MRA HEAD FINDINGS

ANTERIOR CIRCULATION:

Distal cervical segments of the internal carotid arteries are widely
patent with antegrade flow. Petrous cavernous, and supraclinoid
segments widely patent. Approximate 8 x 4 mm focal outpouching
extending from the cavernous left ICA and consistent with aneurysm
(series 4, image 66). This extends posteriorly and slightly
laterally, and demonstrates a fairly wide neck. An additional 2 mm
focal outpouching extending laterally from the cavernous right ICA
also suspicious for possible small aneurysm (series 4, image 37).
ICA termini widely patent.

A1 segments widely patent. Normal anterior communicating artery.
Anterior cerebral arteries widely patent to their distal aspects.

M1 segments widely patent without stenosis. Normal MCA bifurcations.
No proximal M2 occlusion or stenosis. Distal MCA branches well
perfused and symmetric.

POSTERIOR CIRCULATION:

Vertebral arteries patent to the vertebrobasilar junction. Right
vertebral artery dominant. Partially visualized posterior inferior
cerebral arteries patent bilaterally. Basilar artery widely patent
to its distal aspect. Superior cerebral arteries patent bilaterally.
Right PCA supplied via the basilar. Fetal type origin of the left
PCA. PCAs widely patent to their distal aspects without stenosis.
IMPRESSION: MRI HEAD IMPRESSION:

1. No acute intracranial abnormality identified.
2. Remote right PCA territory infarcts involving the right thalamus
and right occipital lobe.
3. Lateral ventriculomegaly with absence of the septum pellucidum.
4. Mild chronic small vessel ischemic disease.

MRA HEAD IMPRESSION:

1. Negative intracranial MRA for large vessel occlusion. No
high-grade or correctable stenosis.
2. Bilateral cavernous ICA aneurysms as above, left larger than
right.
